# Patient Record
Sex: Female | Born: 1960 | Race: White | Hispanic: No | Marital: Married | Smoking: Never smoker
Health system: Southern US, Community
[De-identification: ages and names within clinical notes are randomized; demographics above are authoritative.]

## PROBLEM LIST (undated history)

## (undated) DIAGNOSIS — Z9889 Other specified postprocedural states: Secondary | ICD-10-CM

## (undated) DIAGNOSIS — M549 Dorsalgia, unspecified: Secondary | ICD-10-CM

## (undated) DIAGNOSIS — R350 Frequency of micturition: Secondary | ICD-10-CM

## (undated) DIAGNOSIS — R3915 Urgency of urination: Secondary | ICD-10-CM

## (undated) DIAGNOSIS — N3010 Interstitial cystitis (chronic) without hematuria: Secondary | ICD-10-CM

## (undated) DIAGNOSIS — R351 Nocturia: Secondary | ICD-10-CM

## (undated) DIAGNOSIS — R112 Nausea with vomiting, unspecified: Secondary | ICD-10-CM

## (undated) DIAGNOSIS — F419 Anxiety disorder, unspecified: Secondary | ICD-10-CM

## (undated) HISTORY — DX: Anxiety disorder, unspecified: F41.9

## (undated) HISTORY — DX: Dorsalgia, unspecified: M54.9

---

## 1998-05-15 ENCOUNTER — Other Ambulatory Visit: Admission: RE | Admit: 1998-05-15 | Discharge: 1998-05-15 | Payer: Self-pay | Admitting: Obstetrics & Gynecology

## 1998-08-01 ENCOUNTER — Inpatient Hospital Stay (HOSPITAL_COMMUNITY): Admission: AD | Admit: 1998-08-01 | Discharge: 1998-08-01 | Payer: Self-pay | Admitting: Obstetrics & Gynecology

## 1998-12-06 ENCOUNTER — Inpatient Hospital Stay (HOSPITAL_COMMUNITY): Admission: AD | Admit: 1998-12-06 | Discharge: 1998-12-09 | Payer: Self-pay | Admitting: Obstetrics and Gynecology

## 1999-01-12 ENCOUNTER — Other Ambulatory Visit: Admission: RE | Admit: 1999-01-12 | Discharge: 1999-01-12 | Payer: Self-pay | Admitting: Obstetrics & Gynecology

## 1999-05-23 ENCOUNTER — Ambulatory Visit (HOSPITAL_COMMUNITY): Admission: RE | Admit: 1999-05-23 | Discharge: 1999-05-23 | Payer: Self-pay | Admitting: Obstetrics & Gynecology

## 1999-05-23 ENCOUNTER — Encounter: Payer: Self-pay | Admitting: Obstetrics & Gynecology

## 2000-02-22 ENCOUNTER — Other Ambulatory Visit: Admission: RE | Admit: 2000-02-22 | Discharge: 2000-02-22 | Payer: Self-pay | Admitting: *Deleted

## 2000-10-17 ENCOUNTER — Encounter: Payer: Self-pay | Admitting: Obstetrics & Gynecology

## 2000-10-17 ENCOUNTER — Ambulatory Visit (HOSPITAL_COMMUNITY): Admission: RE | Admit: 2000-10-17 | Discharge: 2000-10-17 | Payer: Self-pay | Admitting: Obstetrics & Gynecology

## 2001-02-05 ENCOUNTER — Other Ambulatory Visit: Admission: RE | Admit: 2001-02-05 | Discharge: 2001-02-05 | Payer: Self-pay | Admitting: Obstetrics & Gynecology

## 2001-08-10 ENCOUNTER — Other Ambulatory Visit: Admission: RE | Admit: 2001-08-10 | Discharge: 2001-08-10 | Payer: Self-pay | Admitting: Obstetrics & Gynecology

## 2002-02-12 ENCOUNTER — Observation Stay (HOSPITAL_COMMUNITY): Admission: AD | Admit: 2002-02-12 | Discharge: 2002-02-13 | Payer: Self-pay | Admitting: Obstetrics & Gynecology

## 2002-02-24 ENCOUNTER — Inpatient Hospital Stay (HOSPITAL_COMMUNITY): Admission: AD | Admit: 2002-02-24 | Discharge: 2002-02-27 | Payer: Self-pay | Admitting: Obstetrics and Gynecology

## 2002-04-06 ENCOUNTER — Other Ambulatory Visit: Admission: RE | Admit: 2002-04-06 | Discharge: 2002-04-06 | Payer: Self-pay | Admitting: Obstetrics & Gynecology

## 2002-05-06 HISTORY — PX: OTHER SURGICAL HISTORY: SHX169

## 2002-09-16 ENCOUNTER — Ambulatory Visit (HOSPITAL_COMMUNITY): Admission: RE | Admit: 2002-09-16 | Discharge: 2002-09-16 | Payer: Self-pay | Admitting: Obstetrics & Gynecology

## 2003-01-21 ENCOUNTER — Encounter: Payer: Self-pay | Admitting: Obstetrics & Gynecology

## 2003-01-21 ENCOUNTER — Encounter: Admission: RE | Admit: 2003-01-21 | Discharge: 2003-01-21 | Payer: Self-pay | Admitting: Obstetrics & Gynecology

## 2003-03-30 ENCOUNTER — Encounter (INDEPENDENT_AMBULATORY_CARE_PROVIDER_SITE_OTHER): Payer: Self-pay | Admitting: Specialist

## 2003-03-30 ENCOUNTER — Ambulatory Visit (HOSPITAL_COMMUNITY): Admission: RE | Admit: 2003-03-30 | Discharge: 2003-03-30 | Payer: Self-pay | Admitting: Urology

## 2003-03-30 ENCOUNTER — Ambulatory Visit (HOSPITAL_BASED_OUTPATIENT_CLINIC_OR_DEPARTMENT_OTHER): Admission: RE | Admit: 2003-03-30 | Discharge: 2003-03-30 | Payer: Self-pay | Admitting: Urology

## 2003-05-27 ENCOUNTER — Other Ambulatory Visit: Admission: RE | Admit: 2003-05-27 | Discharge: 2003-05-27 | Payer: Self-pay | Admitting: Obstetrics and Gynecology

## 2003-10-31 ENCOUNTER — Ambulatory Visit (HOSPITAL_COMMUNITY): Admission: RE | Admit: 2003-10-31 | Discharge: 2003-10-31 | Payer: Self-pay | Admitting: Orthopedic Surgery

## 2004-03-22 ENCOUNTER — Encounter: Admission: RE | Admit: 2004-03-22 | Discharge: 2004-03-22 | Payer: Self-pay | Admitting: Obstetrics & Gynecology

## 2004-09-19 ENCOUNTER — Other Ambulatory Visit: Admission: RE | Admit: 2004-09-19 | Discharge: 2004-09-19 | Payer: Self-pay | Admitting: Obstetrics & Gynecology

## 2004-09-19 ENCOUNTER — Other Ambulatory Visit: Admission: RE | Admit: 2004-09-19 | Discharge: 2004-09-19 | Payer: Self-pay | Admitting: Obstetrics and Gynecology

## 2005-04-08 ENCOUNTER — Other Ambulatory Visit: Admission: RE | Admit: 2005-04-08 | Discharge: 2005-04-08 | Payer: Self-pay | Admitting: Obstetrics & Gynecology

## 2005-04-17 ENCOUNTER — Encounter: Admission: RE | Admit: 2005-04-17 | Discharge: 2005-04-17 | Payer: Self-pay | Admitting: Obstetrics & Gynecology

## 2005-12-20 ENCOUNTER — Ambulatory Visit (HOSPITAL_COMMUNITY): Admission: RE | Admit: 2005-12-20 | Discharge: 2005-12-20 | Payer: Self-pay | Admitting: Obstetrics & Gynecology

## 2005-12-20 ENCOUNTER — Encounter (INDEPENDENT_AMBULATORY_CARE_PROVIDER_SITE_OTHER): Payer: Self-pay | Admitting: Specialist

## 2005-12-20 HISTORY — PX: OTHER SURGICAL HISTORY: SHX169

## 2006-04-24 ENCOUNTER — Encounter: Admission: RE | Admit: 2006-04-24 | Discharge: 2006-04-24 | Payer: Self-pay | Admitting: Obstetrics & Gynecology

## 2006-04-25 ENCOUNTER — Ambulatory Visit (HOSPITAL_COMMUNITY): Admission: RE | Admit: 2006-04-25 | Discharge: 2006-04-25 | Payer: Self-pay | Admitting: *Deleted

## 2006-05-06 HISTORY — PX: OTHER SURGICAL HISTORY: SHX169

## 2006-10-24 ENCOUNTER — Ambulatory Visit (HOSPITAL_COMMUNITY): Admission: RE | Admit: 2006-10-24 | Discharge: 2006-10-24 | Payer: Self-pay | Admitting: Sports Medicine

## 2007-02-24 ENCOUNTER — Encounter: Admission: RE | Admit: 2007-02-24 | Discharge: 2007-02-24 | Payer: Self-pay | Admitting: Otolaryngology

## 2007-04-28 ENCOUNTER — Encounter: Admission: RE | Admit: 2007-04-28 | Discharge: 2007-04-28 | Payer: Self-pay | Admitting: Obstetrics & Gynecology

## 2007-05-25 ENCOUNTER — Ambulatory Visit (HOSPITAL_COMMUNITY): Admission: RE | Admit: 2007-05-25 | Discharge: 2007-05-25 | Payer: Self-pay | Admitting: Family Medicine

## 2007-10-26 ENCOUNTER — Ambulatory Visit (HOSPITAL_COMMUNITY): Admission: RE | Admit: 2007-10-26 | Discharge: 2007-10-26 | Payer: Self-pay | Admitting: Family Medicine

## 2008-05-02 ENCOUNTER — Encounter: Admission: RE | Admit: 2008-05-02 | Discharge: 2008-05-02 | Payer: Self-pay | Admitting: Obstetrics & Gynecology

## 2009-05-04 ENCOUNTER — Encounter: Admission: RE | Admit: 2009-05-04 | Discharge: 2009-05-04 | Payer: Self-pay | Admitting: Obstetrics & Gynecology

## 2010-05-08 ENCOUNTER — Encounter
Admission: RE | Admit: 2010-05-08 | Discharge: 2010-05-08 | Payer: Self-pay | Source: Home / Self Care | Attending: Obstetrics & Gynecology | Admitting: Obstetrics & Gynecology

## 2010-05-27 ENCOUNTER — Encounter: Payer: Self-pay | Admitting: Obstetrics & Gynecology

## 2010-09-21 NOTE — Op Note (Signed)
NAMESHAINE, MOUNT            ACCOUNT NO.:  [id number]   MEDICAL RECORD NO.:  [id number]          PATIENT TYPE:  AMB   LOCATION:  SDC                           FACILITY:  [facility]   PHYSICIAN:  Freddy Finner, M.D.   DATE OF BIRTH:  06/24/1960   DATE OF PROCEDURE:  12/20/2005  DATE OF DISCHARGE:                                 OPERATIVE REPORT   PREOPERATIVE DIAGNOSES:  Menorrhagia, uterine thickening possibly consistent  with adenomyosis.   POSTOPERATIVE DIAGNOSES:  Menorrhagia, uterine thickening possibly  consistent with adenomyosis.   OPERATIVE PROCEDURE:  Hysteroscopy, D&C, NovaSure endometrial ablation.   ESTIMATED INTRAOPERATIVE BLOOD LOSS:  10 mL.   LACTATED RINGER'S:  150.   ANESTHESIA:  Essentially general, started with managed IV sedation,  requiring heavier sedation plus paracervical block which was placed by  injecting approximately 4 mL of 1% plain Xylocaine at 4 and 8 o'clock in the  vaginal fornices.   INTRAOPERATIVE COMPLICATIONS:  None.   The patient is a 50 year old with very heavy menstrual periods who had a  sono histogram in the office which showed some thickening of the myometrium  but no intracavitary abnormalities.  The possibility of failure of the  procedure due to the adenomyosis has been discussed with the patient, and  she is willing to proceed in hopes of getting some significant reduction in  her menorrhagia with a minor surgical procedure.  She was admitted on the  morning of surgery.  She was given a bolus of Ancef preoperatively.  She was  brought to the operating room, placed under IV sedation, placed in the  dorsal lithotomy position.  Mons, perineum and vagina were prepped in the  usual fashion.  Bivalve speculum was introduced.  Cervix was visualized,  grasped on the anterior lip with a single-tooth tenaculum.  Paracervical  block was placed at this time as noted above.  The cervix sounded to 3 cm.  Uterus sounded to 9.5 cm.  The cervix  was progressively dilated to 25 with  Pratts.  The [facility] hysteroscope was introduced using lactated Ringer's as a  distending medium.  The cavity was normal in configuration and no evidence  of polypoid or fibroid masses.  Gentle thorough curettage was carried out  and tissue submitted for examination.  The NovaSure device was applied  without difficulty.  Cavity width was 4.9 cm.  The ablation was accomplished  without difficulty using 162 watts of power for 75 seconds.  Reinspection of  the cavity revealed complete endometrial ablation.  Photographs were made  before and after the ablation.  The patient was awakened and taken to  recovery in good condition.      Freddy Finner, M.D.  Electronically Signed    WRN/MEDQ  D:  12/20/2005  T:  12/20/2005  Job:  [id number]

## 2010-09-21 NOTE — Op Note (Signed)
Kristen Guzman, Kristen Guzman                      ACCOUNT NO.:  [id number]   MEDICAL RECORD NO.:  [id number]                   PATIENT TYPE:  AMB   LOCATION:  SDC                                  [facility]ACILITY:  [facility]   PHYSICIAN:  Freddy Finner, M.D.                DATE OF BIRTH:  April 25, 1961   DATE OF PROCEDURE:  09/16/2002  DATE OF DISCHARGE:                                 OPERATIVE REPORT   PREOPERATIVE DIAGNOSIS:  Multiparity, request for surgical sterilization.   POSTOPERATIVE DIAGNOSES:  1. Multiparity, request for surgical sterilization.  2. Irregular enlargement of the uterus consistent with uterine leiomyomata.     Uterine size approximately three times normal.   PROCEDURES:  1. Laparoscopy.  2. Tubal occlusion with Filshie clips.   SURGEON:  Freddy Finner, M.D.   ANESTHESIA:  General.   ESTIMATED BLOOD LOSS:  5 cc.   COMPLICATIONS:  None.   DESCRIPTION OF PROCEDURE:  The patient is a 50 year old who has requested  permanent sterilization.  She was admitted on the morning of surgery.  She  was brought to the operating room.  She was placed under adequate general  anesthesia and placed in the dorsal lithotomy position using the [employer] system.  Betadine prep was carried out in the usual fashion and  bladder was evacuated with [facility] catheter.  Hulka tenaculum was attached  to the cervix under direct visualization and sterile drapes were applied.  Intraumbilical skin incision was made through, ablated and 11 mm Trocar was  introduced.  Initial attempt was with the nonlighted which was not  successful.  The speculum revealed adequate placement within the peritoneal  cavity without evidence of injury on entry.  Pneumoperitoneum was allowed to  accumulate.  Inspection of pelvic contents revealed findings noted above.  These findings were recorded and still photographed.  Filshie clips were  applied to each tube at the isthmic portion.  The device through the  operating channel with the laparoscope to allow placement with a single  puncture in the abdominal wall.  Gas was then allowed to escape from the  abdomen.  All the instruments were removed.  Local with 0.25% plain Marcaine  was injected at the incision site.  The patient was given Toradol 30 mg IV  intraoperatively.  She was awaken and taken to the recovery room in good  condition.  She will be discharged in the immediate postop period for  followup in the office in approximately two weeks.  She was given Vicodin to  take for postoperative pain.  She is to have routine outpatient surgical  instructions.                                               Freddy Finner, M.D.  WRN/MEDQ  D:  09/16/2002  T:  09/16/2002  Job:  [id number]

## 2010-09-21 NOTE — Op Note (Signed)
Kristen Guzman, Kristen Guzman                      ACCOUNT NO.:  [id number]   MEDICAL RECORD NO.:  [id number]                   PATIENT TYPE:  AMB   LOCATION:  NESC                                 FACILITY:  [facility]   PHYSICIAN:  Maretta Bees. Vonita Moss, M.D.             DATE OF BIRTH:  02-01-1961   DATE OF PROCEDURE:  03/30/2003  DATE OF DISCHARGE:                                 OPERATIVE REPORT   PREOPERATIVE DIAGNOSIS:  Rule out interstitial cystitis.   POSTOPERATIVE DIAGNOSIS:  Rule out interstitial cystitis.   PROCEDURE:  Cystoscopy, hydraulic distention of bladder and cold cup bladder  biopsy.   SURGEON:  Maretta Bees. Vonita Moss, M.D.   ANESTHESIA:  General.   INDICATIONS FOR PROCEDURE:  This 50 year old white female has had bladder  pressure and frequency and has undergone therapy with various medications  and a pelvic pain and frequency score was elevated to 19 which is suspicious  for IC.  Dr. Jennette Kettle has no gynecologic reason for this lady's pain and  discomfort. She is brought to the OR today to rule out interstitial  cystitis.   DESCRIPTION OF PROCEDURE:  The patient was brought to the operating room,  placed in lithotomy position, external genitalia were prepped and draped in  the usual fashion. She was cystoscoped and the bladder mucosa was  unremarkable. I filled her bladder with water and she started voiding around  the cystoscope. I had to put some urethral pressure on the scope to let the  bladder fil and she did hold 800 mL of irrigation fluid but after emptying  the bladder she had wide spread some mucosal petechia and gross hemorrhage  consistent with interstitial cystitis. Cold cup bladder biopsies were taken  from hemorrhagic areas on the bladder base and the biopsy sites were  fulgurated with the Bugbee electrode. At this point, there was good  hemostasis, the bladder was emptied, the scope removed and she was given a  B&O suppository per rectum and 30 mg of Toradol IV and  taken to the recovery  room in good condition having tolerated the procedure well.                                               Maretta Bees. Vonita Moss, M.D.    LJP/MEDQ  D:  03/30/2003  T:  03/30/2003  Job:  [id number]   cc:   Freddy Finner, M.D.  [address] [place]  [place] [address]  Fax: [phone]

## 2010-09-21 NOTE — H&P (Signed)
   NAMEMarland Guzman  PRECIOSA, BUNDRICK                      ACCOUNT NO.:  [id number]   MEDICAL RECORD NO.:  [id number]                   PATIENT TYPE:  INP   LOCATION:  9163                                 [facility]ACILITY:  [facility]   PHYSICIAN:  Tracie Harrier, M.D.              DATE OF BIRTH:  1961/03/20   DATE OF ADMISSION:  02/12/2002  DATE OF DISCHARGE:                                HISTORY & PHYSICAL   HISTORY OF PRESENT ILLNESS:  The patient is a 50 year old female gravida 2,  para 1 at 42 and 1/[redacted] weeks gestation admitted for elective induction of  labor at term.  Her pregnancy was complicated by advanced maternal age.  She  had a normal amniocentesis in second trimester.  Her pregnancy otherwise  went very well.  She is having severe back pain and is now admitted for  elective induction because of this severe back pain.   OB LABORATORIES:  Maternal blood type O+.  Rubella immune.  Group B Strep  negative.  Glucola normal (84).   PAST MEDICAL HISTORY:  None.   PAST SURGICAL HISTORY:  1. Exploratory laparotomy 2002.  2. Cryo surgery 1988.  3. Eye surgery.   OB HISTORY:  Normal spontaneous vaginal delivery x1 at term 34 a female  weighing 7 pounds 11 ounces.   CURRENT MEDICATIONS:  Prenatal vitamins.   ALLERGIES:  PENICILLIN and TETANUS.   PHYSICAL EXAMINATION:  VITAL SIGNS:  Stable.  Temperature 97.2, blood  pressure 115/81, fetal heart tones 150s and reactive.  GENERAL:  She is a well-developed, well-nourished female in no acute  distress.  HEENT:  Within normal limits.  NECK:  Supple without adenopathy or thyromegaly.  HEART:  Clear.  LUNGS:  Clear.  BREASTS:  Deferred.  ABDOMEN:  Gravid, nontender.  EXTREMITIES:  Grossly normal.  NEUROLOGIC:  Grossly normal.  PELVIC:  Normal external female genitalia.  Cervical examination is 2 cm,  thick, vertex, and high.   ADMITTING DIAGNOSES:  1. Intrauterine pregnancy at term.  2. Elective induction of labor.    PLAN:  1. Pitocin  induction of labor.  2. I will attempt at artificial rupture of membranes soon with an expected     normal spontaneous vaginal delivery.                                               Tracie Harrier, M.D.    REG/MEDQ  D:  02/12/2002  T:  02/12/2002  Job:  [id number]

## 2011-04-15 ENCOUNTER — Other Ambulatory Visit: Payer: Self-pay | Admitting: Obstetrics & Gynecology

## 2011-04-15 ENCOUNTER — Other Ambulatory Visit (HOSPITAL_COMMUNITY): Payer: Self-pay | Admitting: Obstetrics & Gynecology

## 2011-04-15 DIAGNOSIS — Z1231 Encounter for screening mammogram for malignant neoplasm of breast: Secondary | ICD-10-CM

## 2011-05-10 ENCOUNTER — Ambulatory Visit
Admission: RE | Admit: 2011-05-10 | Discharge: 2011-05-10 | Disposition: A | Discharge status: Home / Self Care | Payer: BC Managed Care – PPO | Source: Ambulatory Visit | Attending: Obstetrics & Gynecology | Admitting: Obstetrics & Gynecology

## 2011-05-10 DIAGNOSIS — Z1231 Encounter for screening mammogram for malignant neoplasm of breast: Secondary | ICD-10-CM

## 2011-07-12 ENCOUNTER — Other Ambulatory Visit (HOSPITAL_COMMUNITY): Payer: Self-pay | Admitting: Family Medicine

## 2011-07-12 DIAGNOSIS — R1011 Right upper quadrant pain: Secondary | ICD-10-CM

## 2011-07-17 ENCOUNTER — Ambulatory Visit (HOSPITAL_COMMUNITY)
Admission: RE | Admit: 2011-07-17 | Discharge: 2011-07-17 | Disposition: A | Discharge status: Home / Self Care | Payer: BC Managed Care – PPO | Source: Ambulatory Visit | Attending: Family Medicine | Admitting: Family Medicine

## 2011-07-17 DIAGNOSIS — R1011 Right upper quadrant pain: Secondary | ICD-10-CM | POA: Insufficient documentation

## 2011-07-19 ENCOUNTER — Other Ambulatory Visit (HOSPITAL_COMMUNITY): Payer: Self-pay | Admitting: Family Medicine

## 2011-07-19 DIAGNOSIS — R110 Nausea: Secondary | ICD-10-CM

## 2011-07-19 DIAGNOSIS — R1011 Right upper quadrant pain: Secondary | ICD-10-CM

## 2011-07-30 ENCOUNTER — Other Ambulatory Visit (HOSPITAL_COMMUNITY): Payer: BC Managed Care – PPO

## 2011-08-06 ENCOUNTER — Encounter (HOSPITAL_COMMUNITY)
Admission: RE | Admit: 2011-08-06 | Discharge: 2011-08-06 | Disposition: A | Discharge status: Home / Self Care | Payer: BC Managed Care – PPO | Source: Ambulatory Visit | Attending: Family Medicine | Admitting: Family Medicine

## 2011-08-06 DIAGNOSIS — R1011 Right upper quadrant pain: Secondary | ICD-10-CM | POA: Insufficient documentation

## 2011-08-06 DIAGNOSIS — R110 Nausea: Secondary | ICD-10-CM

## 2011-08-06 MED ORDER — SINCALIDE 5 MCG IJ SOLR: 0.0200 ug/kg | Freq: Once | INTRAMUSCULAR | Status: DC

## 2011-08-06 MED ORDER — TECHNETIUM TC 99M MEBROFENIN IV KIT
4.5000 | PACK | Freq: Once | INTRAVENOUS | Status: AC | PRN
  Administered 2011-08-06: 5 via INTRAVENOUS

## 2011-09-12 ENCOUNTER — Other Ambulatory Visit: Payer: Self-pay | Admitting: Urology

## 2011-09-26 ENCOUNTER — Encounter (HOSPITAL_BASED_OUTPATIENT_CLINIC_OR_DEPARTMENT_OTHER): Payer: Self-pay | Admitting: *Deleted

## 2011-09-26 NOTE — Progress Notes (Signed)
NPO AFTER MN. ARRIVES AT 0615. NEEDS HG AND URINE PREG. 

## 2011-10-04 ENCOUNTER — Ambulatory Visit (HOSPITAL_BASED_OUTPATIENT_CLINIC_OR_DEPARTMENT_OTHER)
Admission: RE | Admit: 2011-10-04 | Discharge: 2011-10-04 | Disposition: A | Discharge status: Home / Self Care | Payer: BC Managed Care – PPO | Source: Ambulatory Visit | Attending: Urology | Admitting: Urology

## 2011-10-04 ENCOUNTER — Encounter (HOSPITAL_BASED_OUTPATIENT_CLINIC_OR_DEPARTMENT_OTHER): Payer: Self-pay | Admitting: *Deleted

## 2011-10-04 ENCOUNTER — Encounter (HOSPITAL_BASED_OUTPATIENT_CLINIC_OR_DEPARTMENT_OTHER): Payer: Self-pay | Admitting: Anesthesiology

## 2011-10-04 ENCOUNTER — Ambulatory Visit (HOSPITAL_BASED_OUTPATIENT_CLINIC_OR_DEPARTMENT_OTHER): Payer: BC Managed Care – PPO | Admitting: Anesthesiology

## 2011-10-04 ENCOUNTER — Encounter (HOSPITAL_BASED_OUTPATIENT_CLINIC_OR_DEPARTMENT_OTHER)
Admission: RE | Disposition: A | Discharge status: Home / Self Care | Payer: Self-pay | Source: Ambulatory Visit | Attending: Urology

## 2011-10-04 DIAGNOSIS — N3010 Interstitial cystitis (chronic) without hematuria: Secondary | ICD-10-CM

## 2011-10-04 DIAGNOSIS — N393 Stress incontinence (female) (male): Secondary | ICD-10-CM | POA: Insufficient documentation

## 2011-10-04 HISTORY — PX: CYSTO WITH HYDRODISTENSION: SHX5453

## 2011-10-04 HISTORY — DX: Other specified postprocedural states: Z98.890

## 2011-10-04 HISTORY — DX: Frequency of micturition: R35.0

## 2011-10-04 HISTORY — DX: Interstitial cystitis (chronic) without hematuria: N30.10

## 2011-10-04 HISTORY — DX: Nocturia: R35.1

## 2011-10-04 HISTORY — DX: Urgency of urination: R39.15

## 2011-10-04 HISTORY — DX: Nausea with vomiting, unspecified: R11.2

## 2011-10-04 LAB — POCT I-STAT 4, (NA,K, GLUC, HGB,HCT)
Glucose, Bld: 101 mg/dL — ABNORMAL HIGH (ref 70–99)
HCT: 42 % (ref 36.0–46.0)
Hemoglobin: 14.3 g/dL (ref 12.0–15.0)
Potassium: 3.8 mEq/L (ref 3.5–5.1)
Sodium: 144 mEq/L (ref 135–145)

## 2011-10-04 LAB — POCT PREGNANCY, URINE: Preg Test, Ur: NEGATIVE

## 2011-10-04 SURGERY — CYSTOSCOPY, WITH BLADDER HYDRODISTENSION
Anesthesia: General | Site: Bladder | Wound class: Clean Contaminated

## 2011-10-04 MED ORDER — DEXAMETHASONE SODIUM PHOSPHATE 4 MG/ML IJ SOLN
INTRAMUSCULAR | Status: DC | PRN
  Administered 2011-10-04: 10 mg via INTRAVENOUS

## 2011-10-04 MED ORDER — PROPOFOL 10 MG/ML IV EMUL
INTRAVENOUS | Status: DC | PRN
  Administered 2011-10-04: 250 mg via INTRAVENOUS

## 2011-10-04 MED ORDER — ONDANSETRON HCL 4 MG/2ML IJ SOLN
INTRAMUSCULAR | Status: DC | PRN
  Administered 2011-10-04: 4 mg via INTRAVENOUS

## 2011-10-04 MED ORDER — FENTANYL CITRATE 0.05 MG/ML IJ SOLN: 25.0000 ug | INTRAMUSCULAR | Status: DC | PRN

## 2011-10-04 MED ORDER — OXYBUTYNIN CHLORIDE 5 MG PO TABS
5.0000 mg | ORAL_TABLET | Freq: Once | ORAL | Status: AC
  Administered 2011-10-04: 5 mg via ORAL

## 2011-10-04 MED ORDER — DROPERIDOL 2.5 MG/ML IJ SOLN
INTRAMUSCULAR | Status: DC | PRN
  Administered 2011-10-04: 0.625 mg via INTRAVENOUS

## 2011-10-04 MED ORDER — LACTATED RINGERS IV SOLN
INTRAVENOUS | Status: DC
  Administered 2011-10-04: 07:00:00 via INTRAVENOUS

## 2011-10-04 MED ORDER — LIDOCAINE HCL (CARDIAC) 20 MG/ML IV SOLN
INTRAVENOUS | Status: DC | PRN
  Administered 2011-10-04: 60 mg via INTRAVENOUS

## 2011-10-04 MED ORDER — ACETAMINOPHEN 10 MG/ML IV SOLN
INTRAVENOUS | Status: DC | PRN
  Administered 2011-10-04: 1000 mg via INTRAVENOUS

## 2011-10-04 MED ORDER — KETOROLAC TROMETHAMINE 30 MG/ML IJ SOLN
INTRAMUSCULAR | Status: DC | PRN
  Administered 2011-10-04: 30 mg via INTRAVENOUS

## 2011-10-04 MED ORDER — FENTANYL CITRATE 0.05 MG/ML IJ SOLN
INTRAMUSCULAR | Status: DC | PRN
  Administered 2011-10-04 (×2): 50 ug via INTRAVENOUS

## 2011-10-04 MED ORDER — PHENAZOPYRIDINE HCL 200 MG PO TABS
ORAL | Status: DC | PRN
  Administered 2011-10-04: 08:00:00 via INTRAVESICAL

## 2011-10-04 MED ORDER — MIDAZOLAM HCL 5 MG/5ML IJ SOLN
INTRAMUSCULAR | Status: DC | PRN
  Administered 2011-10-04: 2 mg via INTRAVENOUS

## 2011-10-04 MED ORDER — BUPIVACAINE HCL 0.5 % IJ SOLN
INTRAMUSCULAR | Status: DC | PRN
  Administered 2011-10-04: 35 mL

## 2011-10-04 MED ORDER — METOCLOPRAMIDE HCL 5 MG/ML IJ SOLN
INTRAMUSCULAR | Status: DC | PRN
  Administered 2011-10-04: 5 mg via INTRAVENOUS

## 2011-10-04 MED ORDER — TRIAMCINOLONE ACETONIDE 40 MG/ML IJ SUSP
INTRAMUSCULAR | Status: DC | PRN
  Administered 2011-10-04: 40 mg

## 2011-10-04 MED ORDER — CIPROFLOXACIN IN D5W 200 MG/100ML IV SOLN
200.0000 mg | INTRAVENOUS | Status: AC
  Administered 2011-10-04: 200 mg via INTRAVENOUS

## 2011-10-04 MED ORDER — LACTATED RINGERS IV SOLN: INTRAVENOUS | Status: DC

## 2011-10-04 MED ORDER — PHENAZOPYRIDINE HCL 200 MG PO TABS: 200.0000 mg | ORAL_TABLET | Freq: Three times a day (TID) | ORAL | Status: AC | PRN

## 2011-10-04 MED ORDER — STERILE WATER FOR IRRIGATION IR SOLN
Status: DC | PRN
  Administered 2011-10-04: 3000 mL

## 2011-10-04 MED ORDER — PHENAZOPYRIDINE HCL 200 MG PO TABS
200.0000 mg | ORAL_TABLET | Freq: Once | ORAL | Status: AC
  Administered 2011-10-04: 200 mg via ORAL

## 2011-10-04 MED ORDER — HYDROCODONE-ACETAMINOPHEN 10-325 MG PO TABS: 1.0000 | ORAL_TABLET | Freq: Four times a day (QID) | ORAL | Status: AC | PRN

## 2011-10-04 SURGICAL SUPPLY — 24 items
BAG DRAIN URO-CYSTO SKYTR STRL (DRAIN) ×2 IMPLANT
BAG DRN UROCATH (DRAIN) ×1
CANISTER SUCT LVC 12 LTR MEDI- (MISCELLANEOUS) ×1 IMPLANT
CATH FOLEY 2WAY SLVR  5CC 24FR (CATHETERS)
CATH FOLEY 2WAY SLVR 5CC 24FR (CATHETERS) IMPLANT
CLOTH BEACON ORANGE TIMEOUT ST (SAFETY) ×2 IMPLANT
DRAPE CAMERA CLOSED 9X96 (DRAPES) ×2 IMPLANT
ELECT REM PT RETURN 9FT ADLT (ELECTROSURGICAL) ×2
ELECTRODE REM PT RTRN 9FT ADLT (ELECTROSURGICAL) ×1 IMPLANT
GLOVE BIO SURGEON STRL SZ8 (GLOVE) ×2 IMPLANT
GLOVE INDICATOR 6.5 STRL GRN (GLOVE) ×2 IMPLANT
GLOVE INDICATOR 7.0 STRL GRN (GLOVE) ×2 IMPLANT
GOWN PREVENTION PLUS LG XLONG (DISPOSABLE) ×2 IMPLANT
GOWN STRL REIN XL XLG (GOWN DISPOSABLE) ×2 IMPLANT
IV NS IRRIG 3000ML ARTHROMATIC (IV SOLUTION) IMPLANT
NDL SAFETY ECLIPSE 18X1.5 (NEEDLE) IMPLANT
NDL SPNL 22GX7 QUINCKE BK (NEEDLE) IMPLANT
NEEDLE HYPO 18GX1.5 SHARP (NEEDLE)
NEEDLE HYPO 22GX1.5 SAFETY (NEEDLE) ×1 IMPLANT
NEEDLE SPNL 22GX7 QUINCKE BK (NEEDLE) IMPLANT
NS IRRIG 500ML POUR BTL (IV SOLUTION) IMPLANT
PACK CYSTOSCOPY (CUSTOM PROCEDURE TRAY) ×2 IMPLANT
SYR 20CC LL (SYRINGE) ×2 IMPLANT
WATER STERILE IRR 3000ML UROMA (IV SOLUTION) ×2 IMPLANT

## 2011-10-04 NOTE — Anesthesia Preprocedure Evaluation (Signed)
Anesthesia Evaluation  Patient identified by MRN, date of birth, ID band Patient awake    Reviewed: Allergy & Precautions, H&P , NPO status , Patient's Chart, lab work & pertinent test results  History of Anesthesia Complications (+) PONV  Airway Mallampati: II TM Distance: >3 FB Neck ROM: full    Dental No notable dental hx. (+) Teeth Intact and Dental Advisory Given   Pulmonary neg pulmonary ROS,  breath sounds clear to auscultation  Pulmonary exam normal       Cardiovascular Exercise Tolerance: Good negative cardio ROS  Rhythm:regular Rate:Normal     Neuro/Psych negative neurological ROS  negative psych ROS   GI/Hepatic negative GI ROS, Neg liver ROS,   Endo/Other  negative endocrine ROS  Renal/GU negative Renal ROS  negative genitourinary   Musculoskeletal   Abdominal   Peds  Hematology negative hematology ROS (+)   Anesthesia Other Findings   Reproductive/Obstetrics negative OB ROS                           Anesthesia Physical Anesthesia Plan  ASA: I  Anesthesia Plan: General   Post-op Pain Management:    Induction: Intravenous  Airway Management Planned: LMA  Additional Equipment:   Intra-op Plan:   Post-operative Plan:   Informed Consent: I have reviewed the patients History and Physical, chart, labs and discussed the procedure including the risks, benefits and alternatives for the proposed anesthesia with the patient or authorized representative who has indicated his/her understanding and acceptance.   Dental Advisory Given  Plan Discussed with: CRNA and Surgeon  Anesthesia Plan Comments:         Anesthesia Quick Evaluation  

## 2011-10-04 NOTE — Discharge Instructions (Signed)
Post Bladder Surgery Instructions ° ° °General instructions: °   ° Your recent bladder surgery requires very little post hospital care but some definite precautions. ° °Despite the fact that no skin incisions were used, the area around the bladder incisions are raw and covered with scabs to promote healing and prevent bleeding. Certain precautions are needed to insure that the scabs are not disturbed over the next 2-4 weeks while the healing proceeds. ° °Because the raw surface inside your bladder and the irritating effects of urine you may expect frequency of urination and/or urgency (a stronger desire to urinate) and perhaps even getting up at night more often. This will usually resolve or improve slowly over the healing period. You may see some blood in your urine over the first 6 weeks. Do not be alarmed, even if the urine was clear for a while. Get off your feet and drink lots of fluids until clearing occurs. If you start to pass clots or don't improve call us. ° °Catheter: (If you are discharged with a catheter.) ° °1. Keep your catheter secured to your leg at all times with tape or the supplied strap. °2. You may experience leakage of urine around your catheter- as long as the  °catheter continues to drain, this is normal.  If your catheter stops draining  °go to the ER. °3. You may also have blood in your urine, even after it has been clear for  °several days; you may even pass some small blood clots or other material.  This  °is normal as well.  If this happens, sit down and drink plenty of water to help  °make urine to flush out your bladder.  If the blood in your urine becomes worse  °after doing this, contact our office or return to the ER. °4. You may use the leg bag (small bag) during the day, but use the large bag at  °night. ° °Diet: ° °You may return to your normal diet immediately. Because of the raw surface of your bladder, alcohol, spicy foods, foods high in acid and drinks with caffeine may  cause irritation or frequency and should be used in moderation. To keep your urine flowing freely and avoid constipation, drink plenty of fluids during the day (8-10 glasses). Tip: Avoid cranberry juice because it is very acidic. ° °Activity: ° °Your physical activity doesn't need to be restricted. However, if you are very active, you may see some blood in the urine. We suggest that you reduce your activity under the circumstances until the bleeding has stopped. ° °Bowels: ° °It is important to keep your bowels regular during the postoperative period. Straining with bowel movements can cause bleeding. A bowel movement every other day is reasonable. Use a mild laxative if needed, such as milk of magnesia 2-3 tablespoons, or 2 Dulcolax tablets. Call if you continue to have problems. If you had been taking narcotics for pain, before, during or after your surgery, you may be constipated. Take a laxative if necessary. ° ° ° °Medication: ° °You should resume your pre-surgery medications unless told not to. In addition you may be given an antibiotic to prevent or treat infection. Antibiotics are not always necessary. All medication should be taken as prescribed until the bottles are finished unless you are having an unusual reaction to one of the drugs. ° ° °Post Anesthesia Home Care Instructions ° °Activity: °Get plenty of rest for the remainder of the day. A responsible adult should stay with you for   24 hours following the procedure.  °For the next 24 hours, DO NOT: °-Drive a car °-Operate machinery °-Drink alcoholic beverages °-Take any medication unless instructed by your physician °-Make any legal decisions or sign important papers. ° °Meals: °Start with liquid foods such as gelatin or soup. Progress to regular foods as tolerated. Avoid greasy, spicy, heavy foods. If nausea and/or vomiting occur, drink only clear liquids until the nausea and/or vomiting subsides. Call your physician if vomiting continues. ° °Special  Instructions/Symptoms: °Your throat may feel dry or sore from the anesthesia or the breathing tube placed in your throat during surgery. If this causes discomfort, gargle with warm salt water. The discomfort should disappear within 24 hours. ° ° °

## 2011-10-04 NOTE — Anesthesia Procedure Notes (Signed)
Procedure Name: LMA Insertion Date/Time: 10/04/2011 7:38 AM Performed by: Norva Pavlov Pre-anesthesia Checklist: Patient identified, Emergency Drugs available, Suction available and Patient being monitored Patient Re-evaluated:Patient Re-evaluated prior to inductionOxygen Delivery Method: Circle System Utilized Preoxygenation: Pre-oxygenation with 100% oxygen Intubation Type: IV induction Ventilation: Mask ventilation without difficulty LMA: LMA inserted LMA Size: 4.0 Number of attempts: 1 Airway Equipment and Method: bite block Placement Confirmation: positive ETCO2 Tube secured with: Tape Dental Injury: Teeth and Oropharynx as per pre-operative assessment

## 2011-10-04 NOTE — Op Note (Signed)
PATIENT:  Kristen Guzman  PRE-OPERATIVE DIAGNOSIS:  1. History of interstitial cystitis area 2. Stress urinary incontinence  POST-OPERATIVE DIAGNOSIS:  Same  PROCEDURE:  Procedure(s): 1. Cystoscopy with hydrodistention. 2. Urethral dilatation. 3. Injection of local anesthetic.  SURGEON:  Garnett Farm  INDICATION: Mrs. Darnold is a 51 year old female patient with a history of interstitial cystitis. She underwent cystoscopy with hydrodistention in 10/04 revealing an 800 cc capacity bladder under anesthesia with a bladder biopsy that revealed no neoplastic findings but the presence of increased number of mass cells felt to indicate possible interstitial cystitis. She responded quite positively to the hydrodistention alone. She is returned having had a recent UTI and continued urethral irritation with associated frequency, urgency and symptoms similar to her previous interstitial cystitis. We discussed the treatment options and because she had had a good response to hydrodistention the past she elected to proceed with repeat hydrodistention. In addition she has described stress urinary incontinence by history and I told her I would evaluate her under anesthesia for the presence of a cystocele and urethrovesical junction support.  ANESTHESIA:  General  EBL:  Minimal  Description of procedure: After informed consent the patient was taken to the major operating room where she was placed on the table, administered general anesthesia and then moved to the dorsal lithotomy position. Her genitalia sterilely prepped and draped. An official timeout was then performed.   I initially evaluated the anterior vaginal wall by depressing the posterior vaginal wall and checking for the presence of a cystocele. She had no significant cystocele present. There was some laxity in the area of the urethrovesical junction. No lesions were identified. The urethra was palpably normal.  The 21 French cystoscope with  12 lens was then advanced under direct vision through the urethra and ureter was noted be entirely normal. The bladder was then entered and a single previous biopsy scar was noted on the floor of the bladder on the right-hand side. The ureteral orifices were slitlike and in normal position. The bladder was fully and systematically inspected and noted to be free of any tumors, stones or inflammatory lesions. I then performed hydrodistention to 20 cm H2O and then drained the bladder. The bladder capacity was noted to be 800 cc. The terminal effluent was clear. Reinspection of the bladder revealed no significant petechial hemorrhages or glomerulations.  Urethral dilatation was then performed using female sounds starting at 46 [place] and progressing to 68 [place] without difficulty.  I then inserted a 22 French Foley catheter and repeated her hydrodistention. She was left distended for approximately 5 minutes after which the bladder was drained. Again the terminal effluent was noted be clear. With the catheter in place I then performed local anesthetic injection.  With a catheter as a guide to the location of urethra and the balloon as a guide to the level of the bladder neck I injected periurethrally primarily at the level of the bladder neck and extending distally along the course of the urethra on each side using a total of 10 cc of half percent plain Marcaine with 40 mg of Kenalog. I then used a female sound to instill 400 mg of crushed peridium and 10 cc of half percent Marcaine into the bladder at the end of the procedure.  The patient was then awakened and taken to the recovery room in stable and satisfactory condition. She tolerated the procedure well and there were no intraoperative complications.  PLAN OF CARE: Discharge to home after PACU  PATIENT DISPOSITION:  PACU - hemodynamically stable.

## 2011-10-04 NOTE — Transfer of Care (Signed)
Immediate Anesthesia Transfer of Care Note  Patient: Kristen Guzman  Procedure(s) Performed: Procedure(s) (LRB): CYSTOSCOPY/HYDRODISTENSION (N/A)  Patient Location: PACU  Anesthesia Type: General  Level of Consciousness: drowsy, arouses to name, follows commands  Airway & Oxygen Therapy: Patient Spontanous Breathing and Patient connected to face mask oxygen  Post-op Assessment: Report given to PACU RN and Post -op Vital signs reviewed and stable  Post vital signs: Reviewed and stable  Complications: No apparent anesthesia complications

## 2011-10-04 NOTE — H&P (Signed)
History of Present Illness     Possible IC: She was evaluated by Dr. Vonita Moss for urinary frequency and he indicated that he had performed cystoscopy and hydrodistention in the distant past. When he saw her in 10/04 he performed cystoscopy and hydrodistention with an 800 cc capacity found under anesthesia and a bladder biopsy that was benign but revealed increased number of mast cells. He prescribed Elmiron but she did not take the medication and found that she responded to the hydrodistention with resolution of her symptoms for least 3 years.   Interval history:she reports that she recently had a UTI about 7 weeks ago. She has continued to have extraurethral irritation and has seen her gynecologist who felt there was no evidence of yeast and placed her on nystatin/triamcinolone for the effects of the steroid and she is noting some slight improvement. In addition to this she has significant voiding symptoms consisting of frequency urgency but no dysuria. She does have IC and she said this feels exactly like a flare up of her interstitial cystitis. Her voiding symptoms are moderate in severity with no modifying factors.  She mentioned also that she's having difficulty with stress urinary incontinence. She said she had a recent cold and was doing a lot of coughing and this caused significant leakage. This led to her having to wear a protective pad which she believes is in part responsible for the irritation that she has experienced externally.   Past Medical History Problems  1. History of  Plantar Fasciitis 728.71  Surgical History Problems  1. History of  Cystoscopy With Dilation Of Bladder 2. History of  Exploratory Laparotomy 3. History of  Tubal Ligation V25.2  Allergies Medication  1. Elmiron CAPS 2. Penicillins  Family History Problems  1. Family history of  Death In The Family Father died age 60 of pancreatic cancer 2. Paternal history of  Pancreatic Neoplasm 3. Maternal  grandfather's history of  Renal Failure  Social History Problems    Alcohol Use 1 a month   Caffeine Use 1diet soda a day   Marital History - Currently Married   Never A Smoker   Occupation: teaches radiation therapy at [employer]    History of  Tobacco Use V15.82  Review of Systems Genitourinary, constitutional, skin, eye, otolaryngeal, hematologic/lymphatic, cardiovascular, pulmonary, endocrine, musculoskeletal, gastrointestinal, neurological and psychiatric system(s) were reviewed and pertinent findings if present are noted.  Genitourinary: urinary frequency, urinary urgency, nocturia and incontinence, but no dysuria.  Neurological: headache.    Vitals Vital Signs BMI Calculated: 32.29 BSA Calculated: 2.14 Height: 5 ft 9 in Weight: 218 lb  Blood Pressure: 126 / 78 Heart Rate: 61  Physical Exam Constitutional: Well nourished and well developed . No acute distress.  ENT:. The ears and nose are normal in appearance.  Neck: The appearance of the neck is normal and no neck mass is present.  Pulmonary: No respiratory distress and normal respiratory rhythm and effort.  Cardiovascular: Heart rate and rhythm are normal . No peripheral edema.  Abdomen: The abdomen is soft and nontender. No masses are palpated. No CVA tenderness. No hernias are palpable. No hepatosplenomegaly noted.  Lymphatics: The femoral and inguinal nodes are not enlarged or tender.  Skin: Normal skin turgor, no visible rash and no visible skin lesions.  Neuro/Psych:. Mood and affect are appropriate.    Results/Data Urine  COLOR YELLOW   APPEARANCE CLEAR   SPECIFIC GRAVITY 1.010   pH 6.0   GLUCOSE NEG mg/dL  BILIRUBIN  NEG   KETONE NEG mg/dL  BLOOD NEG   PROTEIN NEG mg/dL  UROBILINOGEN 0.2 mg/dL  NITRITE NEG   LEUKOCYTE ESTERASE NEG    Assessment Assessed  1. Chronic Interstitial Cystitis 595.1 2. Female Stress Incontinence 625.6   We discussed the treatment options. She has  elected to proceed with repeat hydrodistention which has been effective for her in the past. We went over the procedure today and she is familiar with it as well as its risks and complications and would like to proceed in that fashion. I will assess the degree of urethral hypermobility and anterior vaginal wall support under anesthesia but did not recommend any form of surgical intervention for her stress incontinence at this time due to her significant bladder symptoms. If they persist a mid urethral sling procedure may be indicated.   Plan    She is scheduled for outpatient cystoscopy with hydrodistention.

## 2011-10-04 NOTE — Anesthesia Postprocedure Evaluation (Signed)
  Anesthesia Post-op Note  Patient: Kristen Guzman  Procedure(s) Performed: Procedure(s) (LRB): CYSTOSCOPY/HYDRODISTENSION (N/A)  Patient Location: PACU  Anesthesia Type: General  Level of Consciousness: awake and alert   Airway and Oxygen Therapy: Patient Spontanous Breathing  Post-op Pain: mild  Post-op Assessment: Post-op Vital signs reviewed, Patient's Cardiovascular Status Stable, Respiratory Function Stable, Patent Airway and No signs of Nausea or vomiting  Post-op Vital Signs: stable  Complications: No apparent anesthesia complications

## 2011-10-07 ENCOUNTER — Encounter (HOSPITAL_BASED_OUTPATIENT_CLINIC_OR_DEPARTMENT_OTHER): Payer: Self-pay | Admitting: Urology

## 2012-05-26 ENCOUNTER — Other Ambulatory Visit: Payer: Self-pay | Admitting: Obstetrics & Gynecology

## 2012-05-26 DIAGNOSIS — Z1231 Encounter for screening mammogram for malignant neoplasm of breast: Secondary | ICD-10-CM

## 2012-06-26 ENCOUNTER — Ambulatory Visit
Admission: RE | Admit: 2012-06-26 | Discharge: 2012-06-26 | Disposition: A | Discharge status: Home / Self Care | Payer: BC Managed Care – PPO | Source: Ambulatory Visit | Attending: Obstetrics & Gynecology | Admitting: Obstetrics & Gynecology

## 2012-06-26 DIAGNOSIS — Z1231 Encounter for screening mammogram for malignant neoplasm of breast: Secondary | ICD-10-CM

## 2013-03-03 ENCOUNTER — Other Ambulatory Visit: Payer: Self-pay | Admitting: Otolaryngology

## 2013-05-27 ENCOUNTER — Other Ambulatory Visit (HOSPITAL_COMMUNITY): Payer: Self-pay | Admitting: Obstetrics & Gynecology

## 2013-05-27 DIAGNOSIS — Z1231 Encounter for screening mammogram for malignant neoplasm of breast: Secondary | ICD-10-CM

## 2013-07-01 ENCOUNTER — Ambulatory Visit (HOSPITAL_COMMUNITY): Payer: BC Managed Care – PPO

## 2013-07-07 ENCOUNTER — Ambulatory Visit (HOSPITAL_COMMUNITY)
Admission: RE | Admit: 2013-07-07 | Discharge: 2013-07-07 | Disposition: A | Discharge status: Home / Self Care | Payer: BC Managed Care – PPO | Source: Ambulatory Visit | Attending: Obstetrics & Gynecology | Admitting: Obstetrics & Gynecology

## 2013-07-07 DIAGNOSIS — Z1231 Encounter for screening mammogram for malignant neoplasm of breast: Secondary | ICD-10-CM | POA: Insufficient documentation

## 2014-01-25 ENCOUNTER — Other Ambulatory Visit: Payer: Self-pay | Admitting: Sports Medicine

## 2014-01-25 DIAGNOSIS — M545 Low back pain: Secondary | ICD-10-CM

## 2014-01-31 ENCOUNTER — Ambulatory Visit
Admission: RE | Admit: 2014-01-31 | Discharge: 2014-01-31 | Disposition: A | Discharge status: Home / Self Care | Payer: BC Managed Care – PPO | Source: Ambulatory Visit | Attending: Sports Medicine | Admitting: Sports Medicine

## 2014-01-31 DIAGNOSIS — M545 Low back pain: Secondary | ICD-10-CM

## 2014-06-14 ENCOUNTER — Other Ambulatory Visit: Payer: Self-pay | Admitting: Obstetrics and Gynecology

## 2014-06-15 LAB — CYTOLOGY - PAP

## 2014-08-04 ENCOUNTER — Other Ambulatory Visit (HOSPITAL_COMMUNITY): Payer: Self-pay | Admitting: Obstetrics & Gynecology

## 2014-08-04 DIAGNOSIS — Z1231 Encounter for screening mammogram for malignant neoplasm of breast: Secondary | ICD-10-CM

## 2014-08-17 ENCOUNTER — Ambulatory Visit (HOSPITAL_COMMUNITY)
Admission: RE | Admit: 2014-08-17 | Discharge: 2014-08-17 | Disposition: A | Discharge status: Home / Self Care | Payer: BC Managed Care – PPO | Source: Ambulatory Visit | Attending: Obstetrics & Gynecology | Admitting: Obstetrics & Gynecology

## 2014-08-17 DIAGNOSIS — Z1231 Encounter for screening mammogram for malignant neoplasm of breast: Secondary | ICD-10-CM | POA: Diagnosis present

## 2015-08-31 ENCOUNTER — Other Ambulatory Visit: Payer: Self-pay

## 2015-08-31 DIAGNOSIS — Z1231 Encounter for screening mammogram for malignant neoplasm of breast: Secondary | ICD-10-CM

## 2015-09-20 ENCOUNTER — Ambulatory Visit
Admission: RE | Admit: 2015-09-20 | Discharge: 2015-09-20 | Disposition: A | Discharge status: Home / Self Care | Payer: BC Managed Care – PPO | Source: Ambulatory Visit

## 2015-09-20 DIAGNOSIS — Z1231 Encounter for screening mammogram for malignant neoplasm of breast: Secondary | ICD-10-CM

## 2016-12-09 ENCOUNTER — Other Ambulatory Visit: Payer: Self-pay | Admitting: Obstetrics & Gynecology

## 2016-12-09 DIAGNOSIS — Z1231 Encounter for screening mammogram for malignant neoplasm of breast: Secondary | ICD-10-CM

## 2016-12-16 ENCOUNTER — Ambulatory Visit
Admission: RE | Admit: 2016-12-16 | Discharge: 2016-12-16 | Disposition: A | Discharge status: Home / Self Care | Payer: BC Managed Care – PPO | Source: Ambulatory Visit | Attending: Obstetrics & Gynecology | Admitting: Obstetrics & Gynecology

## 2016-12-16 DIAGNOSIS — Z1231 Encounter for screening mammogram for malignant neoplasm of breast: Secondary | ICD-10-CM

## 2017-12-09 ENCOUNTER — Other Ambulatory Visit: Payer: Self-pay | Admitting: Obstetrics & Gynecology

## 2017-12-09 DIAGNOSIS — Z1231 Encounter for screening mammogram for malignant neoplasm of breast: Secondary | ICD-10-CM

## 2018-01-01 ENCOUNTER — Ambulatory Visit
Admission: RE | Admit: 2018-01-01 | Discharge: 2018-01-01 | Disposition: A | Discharge status: Home / Self Care | Payer: BC Managed Care – PPO | Source: Ambulatory Visit | Attending: Obstetrics & Gynecology | Admitting: Obstetrics & Gynecology

## 2018-01-01 DIAGNOSIS — Z1231 Encounter for screening mammogram for malignant neoplasm of breast: Secondary | ICD-10-CM

## 2018-12-01 ENCOUNTER — Other Ambulatory Visit: Payer: Self-pay | Admitting: Obstetrics & Gynecology

## 2018-12-01 DIAGNOSIS — Z1231 Encounter for screening mammogram for malignant neoplasm of breast: Secondary | ICD-10-CM

## 2019-01-13 ENCOUNTER — Ambulatory Visit
Admission: RE | Admit: 2019-01-13 | Discharge: 2019-01-13 | Disposition: A | Discharge status: Home / Self Care | Payer: BC Managed Care – PPO | Source: Ambulatory Visit | Attending: Obstetrics & Gynecology | Admitting: Obstetrics & Gynecology

## 2019-01-13 ENCOUNTER — Other Ambulatory Visit: Payer: Self-pay

## 2019-01-13 DIAGNOSIS — Z1231 Encounter for screening mammogram for malignant neoplasm of breast: Secondary | ICD-10-CM

## 2020-03-07 ENCOUNTER — Other Ambulatory Visit: Payer: Self-pay | Admitting: Obstetrics & Gynecology

## 2020-03-07 DIAGNOSIS — Z1231 Encounter for screening mammogram for malignant neoplasm of breast: Secondary | ICD-10-CM

## 2020-04-13 ENCOUNTER — Ambulatory Visit
Admission: RE | Admit: 2020-04-13 | Discharge: 2020-04-13 | Disposition: A | Discharge status: Home / Self Care | Payer: BC Managed Care – PPO | Source: Ambulatory Visit | Attending: Obstetrics & Gynecology | Admitting: Obstetrics & Gynecology

## 2020-04-13 ENCOUNTER — Other Ambulatory Visit: Payer: Self-pay

## 2020-04-13 DIAGNOSIS — Z1231 Encounter for screening mammogram for malignant neoplasm of breast: Secondary | ICD-10-CM

## 2021-02-06 IMAGING — MG DIGITAL SCREENING BILAT W/ TOMO W/ CAD
6 of 10 series · 6 of 30 positions shown · non-contrast
Comparison: Previous exam(s).

CLINICAL DATA: Screening.

EXAM:
DIGITAL SCREENING BILATERAL MAMMOGRAM WITH TOMO AND CAD

[R CC synth-2D]
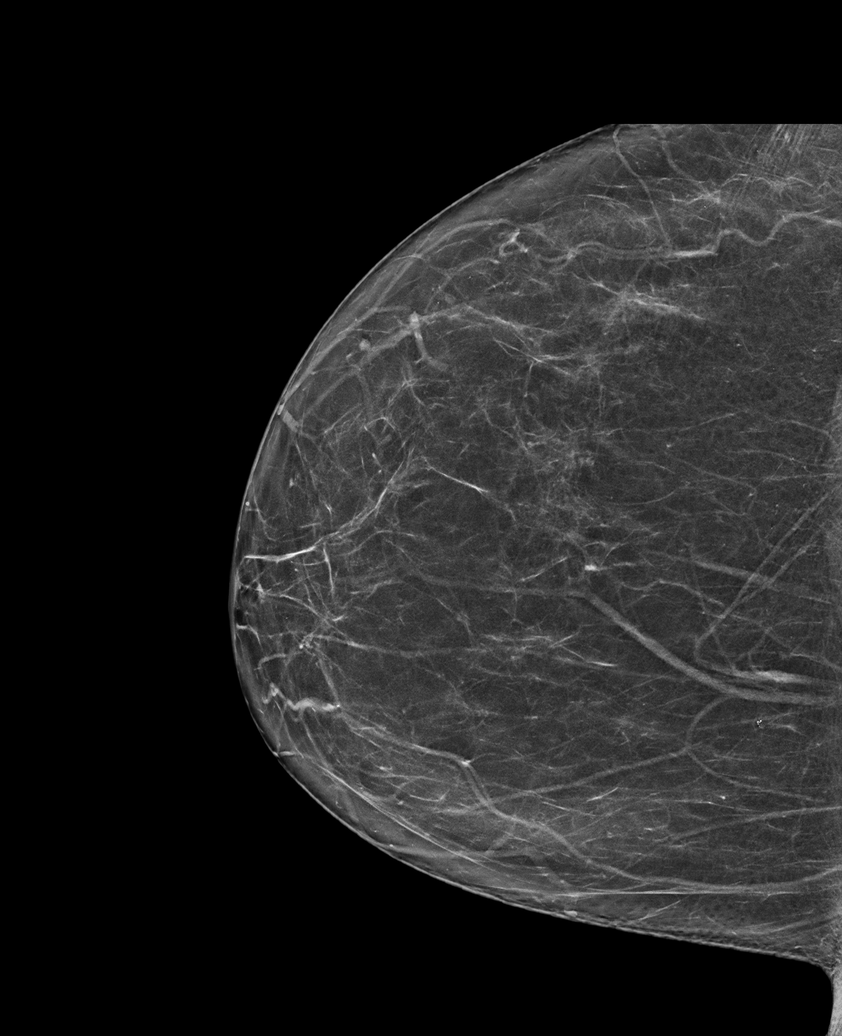

[L MLO synth-2D (1 of 2)]
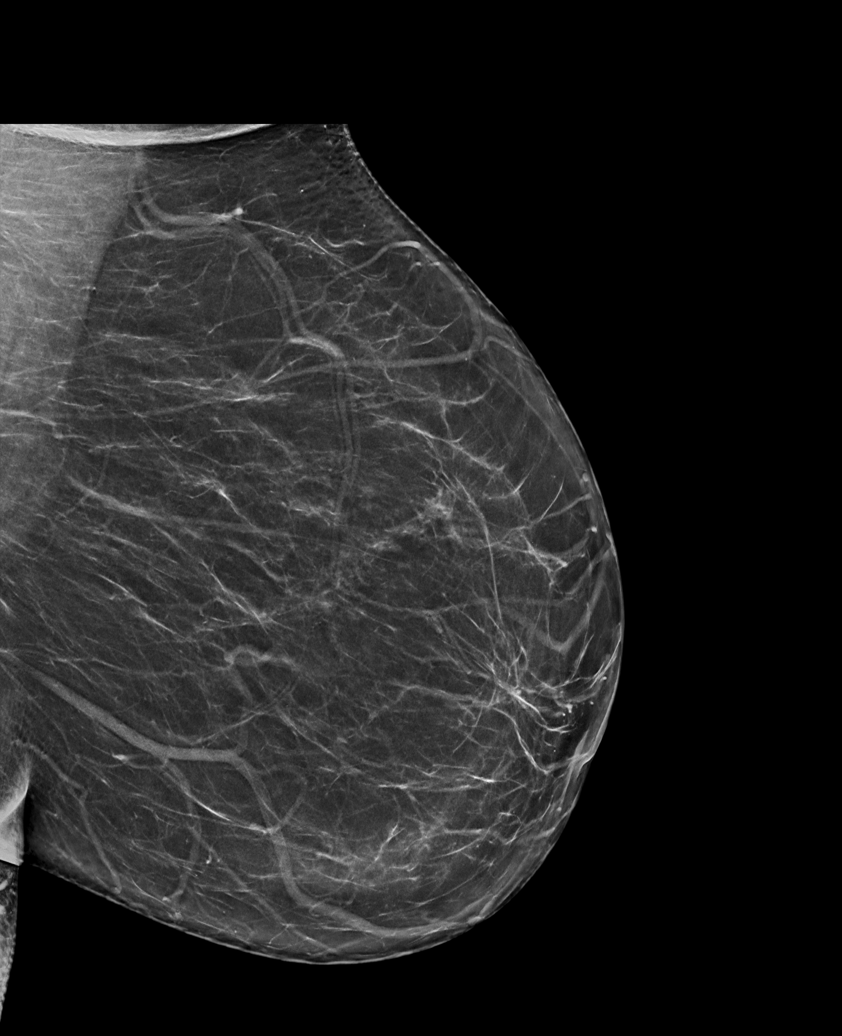

[R MLO synth-2D]
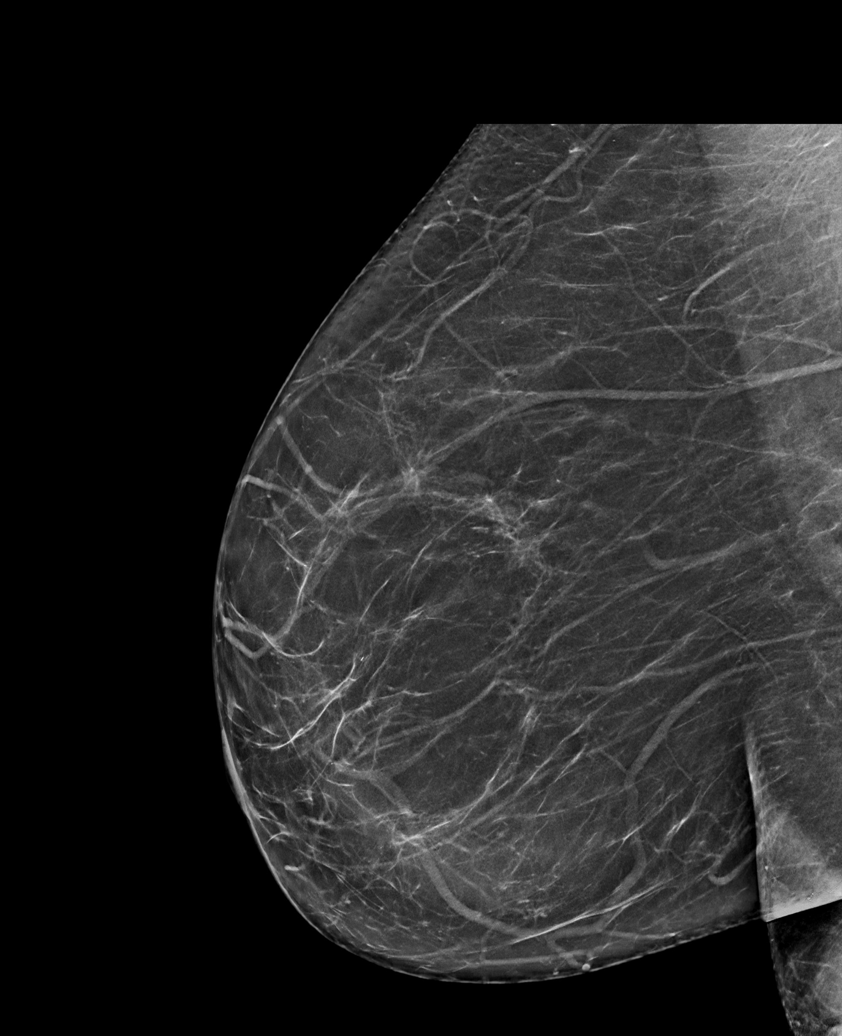

[L CC synth-2D]
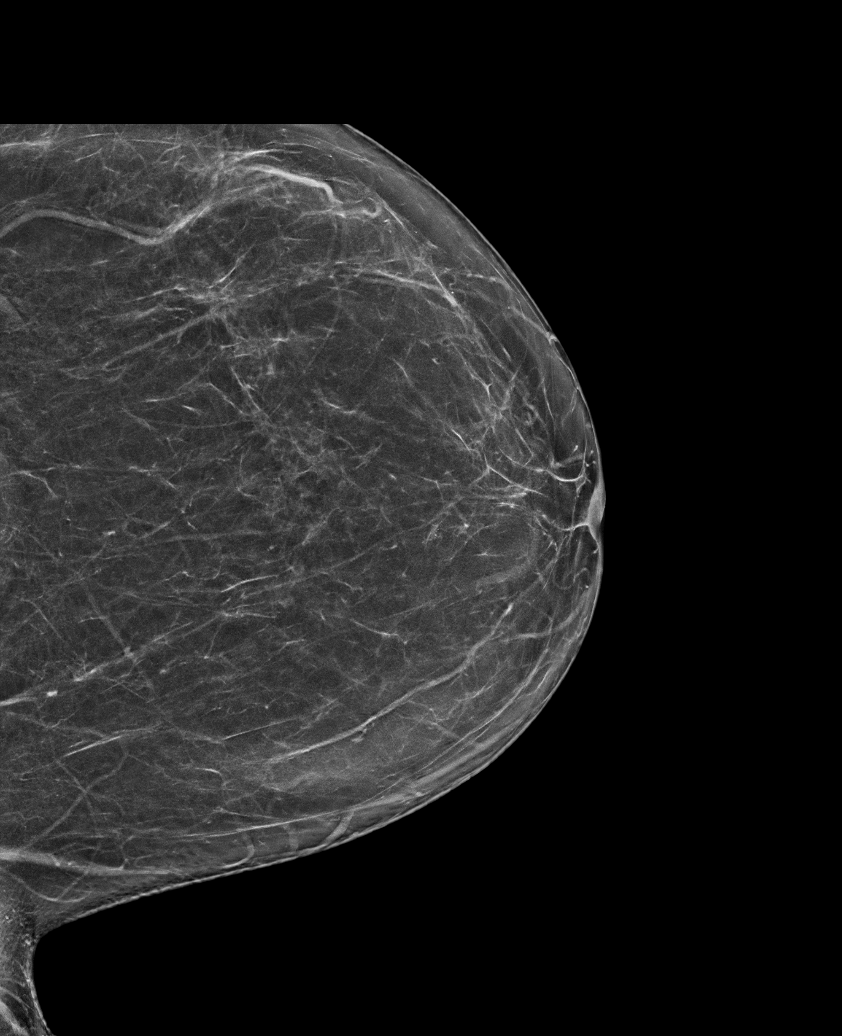

[L MLO synth-2D (2 of 2)]
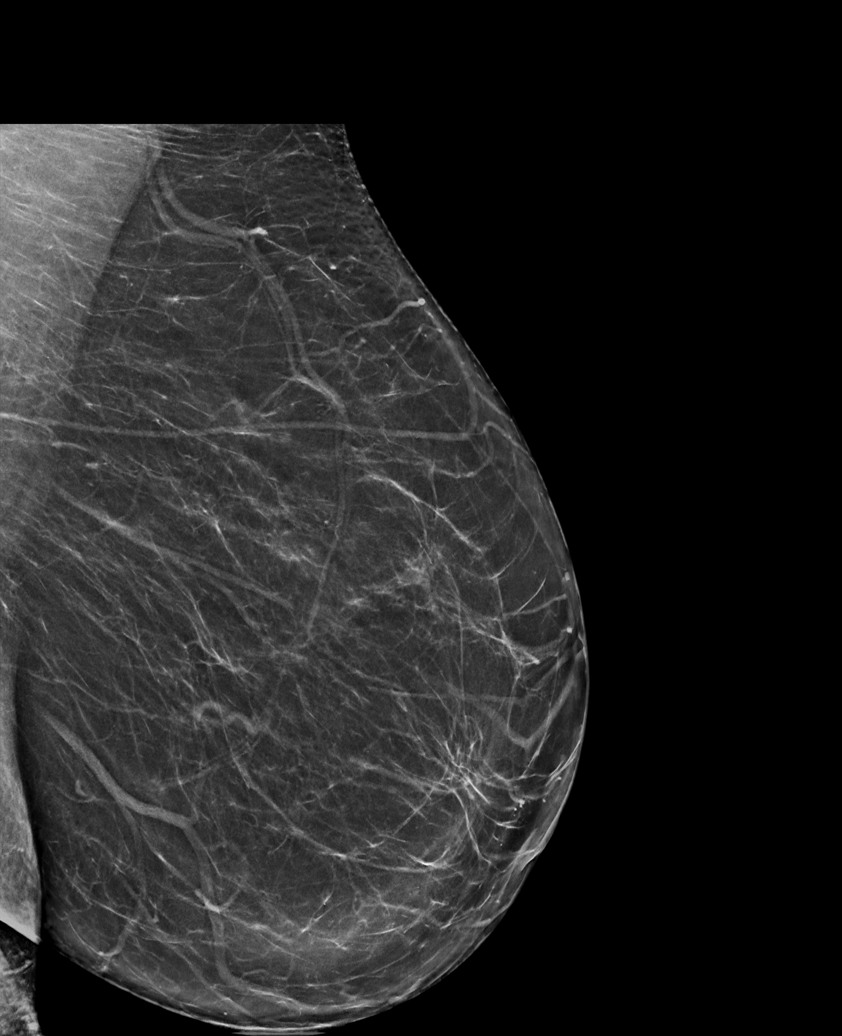

[L CC tomo · tomo slice 37/74.0]
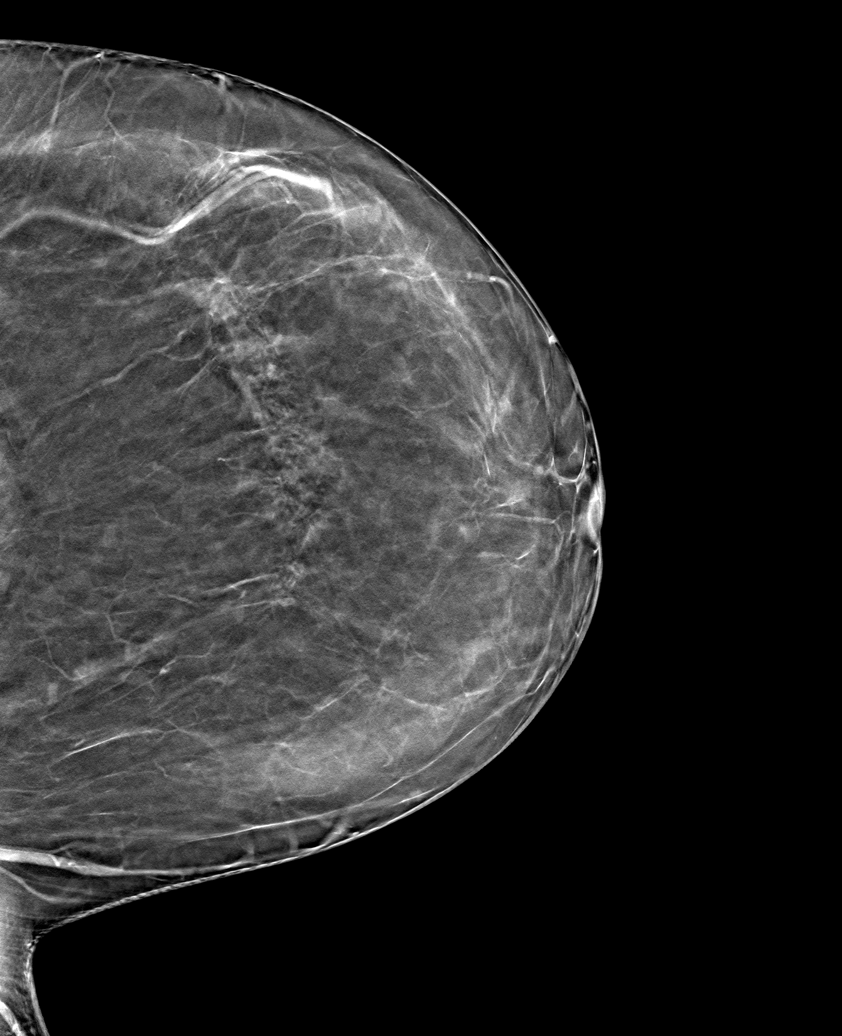

[6 of 30 positions shown; findings below may reference images not displayed]

ACR Breast Density Category b: There are scattered areas of
fibroglandular density.
FINDINGS: There are no findings suspicious for malignancy. Images were
processed with CAD.
IMPRESSION: No mammographic evidence of malignancy. A result letter of this
screening mammogram will be mailed directly to the patient.

RECOMMENDATION:
Screening mammogram in one year. (Code:[id number])

BI-RADS CATEGORY  1: Negative.

## 2021-04-05 ENCOUNTER — Other Ambulatory Visit: Payer: Self-pay | Admitting: Obstetrics & Gynecology

## 2021-04-05 DIAGNOSIS — Z1231 Encounter for screening mammogram for malignant neoplasm of breast: Secondary | ICD-10-CM

## 2021-05-17 ENCOUNTER — Ambulatory Visit: Payer: BC Managed Care – PPO

## 2022-06-27 ENCOUNTER — Other Ambulatory Visit (HOSPITAL_COMMUNITY)
Admission: RE | Admit: 2022-06-27 | Discharge: 2022-06-27 | Disposition: A | Discharge status: Home / Self Care | Payer: BC Managed Care – PPO | Source: Ambulatory Visit | Attending: Radiology | Admitting: Radiology

## 2022-06-27 ENCOUNTER — Ambulatory Visit (INDEPENDENT_AMBULATORY_CARE_PROVIDER_SITE_OTHER): Payer: BC Managed Care – PPO | Admitting: Radiology

## 2022-06-27 ENCOUNTER — Encounter: Payer: Self-pay | Admitting: Radiology

## 2022-06-27 VITALS — BP 122/82 | Ht 67.0 in

## 2022-06-27 DIAGNOSIS — F419 Anxiety disorder, unspecified: Secondary | ICD-10-CM

## 2022-06-27 DIAGNOSIS — Z01419 Encounter for gynecological examination (general) (routine) without abnormal findings: Secondary | ICD-10-CM | POA: Diagnosis not present

## 2022-06-27 MED ORDER — ALPRAZOLAM 0.25 MG PO TABS: 0.2500 mg | ORAL_TABLET | Freq: Two times a day (BID) | ORAL | 0 refills | Status: DC | PRN

## 2022-06-27 NOTE — Progress Notes (Signed)
   Kristen Guzman 04-14-1961 [id number]   History: Postmenopausal 62 y.o. presents for annual exam, transfer from [facility]. No gyn concerns. Needs a refill on alprazolam to use prn for anxiety. Increased stressors at home.   Gynecologic History Postmenopausal Last Pap: 2019. Results were: normal Last mammogram: 05/2022. Results were: normal Last colonoscopy: 2024 HRT use: never  Obstetric History OB History  Gravida Para Term Preterm AB Living  2 2       2  $ SAB IAB Ectopic Multiple Live Births          2    # Outcome Date GA Lbr Len/2nd Weight Sex Delivery Anes PTL Lv  2 Para           1 Para              The following portions of the patient's history were reviewed and updated as appropriate: allergies, current medications, past family history, past medical history, past social history, past surgical history, and problem list.  Review of Systems Pertinent items noted in HPI and remainder of comprehensive ROS otherwise negative.  Past medical history, past surgical history, family history and social history were all reviewed and documented in the EPIC chart.  Exam:  Vitals:   06/27/22 0943  BP: 122/82  Height: 5' 7"$  (1.702 m)   There is no height or weight on file to calculate BMI.  General appearance:  Normal Thyroid:  Symmetrical, normal in size, without palpable masses or nodularity. Respiratory  Auscultation:  Clear without wheezing or rhonchi Cardiovascular  Auscultation:  Regular rate, without rubs, murmurs or gallops  Edema/varicosities:  Not grossly evident Abdominal  Soft,nontender, without masses, guarding or rebound.  Liver/spleen:  No organomegaly noted  Hernia:  None appreciated  Skin  Inspection:  Grossly normal Breasts: Examined lying and sitting.   Right: Without masses, retractions, nipple discharge or axillary adenopathy.   Left: Without masses, retractions, nipple discharge or axillary adenopathy. Genitourinary   Inguinal/mons:  Normal  without inguinal adenopathy  External genitalia:  Normal appearing vulva with no masses, tenderness, or lesions  BUS/Urethra/Skene's glands:  Normal  Vagina:  Normal appearing with normal color and discharge, no lesions. Atrophy: mild   Cervix:  Normal appearing without discharge or lesions  Uterus:  Normal in size, shape and contour.  Midline and mobile, nontender  Adnexa/parametria:     Rt: Normal in size, without masses or tenderness.   Lt: Normal in size, without masses or tenderness.  Anus and perineum: Normal    Patient informed chaperone available to be present for breast and pelvic exam. Patient has requested no chaperone to be present. Patient has been advised what will be completed during breast and pelvic exam.   Assessment/Plan:   1. Anxiety  - ALPRAZolam (XANAX) 0.25 MG tablet; Take 1 tablet (0.25 mg total) by mouth 2 (two) times daily as needed for anxiety.  Dispense: 30 tablet; Refill: 0  2. Well woman exam with routine gynecological exam  - Cytology - PAP( [facility])    Discussed SBE, colonoscopy and DEXA screening as directed. Recommend 1100mns of exercise weekly, including weight bearing exercise. Encouraged the use of seatbelts and sunscreen.  Return in 1 year for annual or sooner prn.  CRubbie BattiestB WHNP-BC, 11:12 AM 06/27/2022

## 2022-06-28 LAB — CYTOLOGY - PAP
Comment: NEGATIVE
Diagnosis: NEGATIVE
High risk HPV: NEGATIVE

## 2022-07-03 NOTE — Telephone Encounter (Signed)
She would have to get this from her PCP

## 2022-10-17 ENCOUNTER — Other Ambulatory Visit: Payer: Self-pay | Admitting: Family Medicine

## 2022-10-17 DIAGNOSIS — R1011 Right upper quadrant pain: Secondary | ICD-10-CM

## 2023-07-02 ENCOUNTER — Ambulatory Visit (INDEPENDENT_AMBULATORY_CARE_PROVIDER_SITE_OTHER): Payer: 59 | Admitting: Radiology

## 2023-07-02 ENCOUNTER — Encounter: Payer: Self-pay | Admitting: Radiology

## 2023-07-02 VITALS — BP 116/78

## 2023-07-02 DIAGNOSIS — F419 Anxiety disorder, unspecified: Secondary | ICD-10-CM

## 2023-07-02 DIAGNOSIS — Z1331 Encounter for screening for depression: Secondary | ICD-10-CM

## 2023-07-02 DIAGNOSIS — Z01419 Encounter for gynecological examination (general) (routine) without abnormal findings: Secondary | ICD-10-CM

## 2023-07-02 MED ORDER — ALPRAZOLAM 0.25 MG PO TABS: 0.2500 mg | ORAL_TABLET | Freq: Two times a day (BID) | ORAL | 0 refills | Status: DC | PRN

## 2023-07-02 NOTE — Progress Notes (Signed)
   Kristen Guzman 12-07-1960 [id number]   History: Postmenopausal 63 y.o. presents for annual exam. No gyn concerns. Needs a refill on alprazolam to use prn for anxiety. Increased stressors at home.   Gynecologic History Postmenopausal Last Pap: 2014. Results were: normal Last mammogram: 05/2023. Results were: normal Last colonoscopy: 2024 HRT use: never  Obstetric History OB History  Gravida Para Term Preterm AB Living  2 2    2   SAB IAB Ectopic Multiple Live Births      2    # Outcome Date GA Lbr Len/2nd Weight Sex Type Anes PTL Lv  2 Para           1 Para              The following portions of the patient's history were reviewed and updated as appropriate: allergies, current medications, past family history, past medical history, past social history, past surgical history, and problem list.  Review of Systems Pertinent items noted in HPI and remainder of comprehensive ROS otherwise negative.  Past medical history, past surgical history, family history and social history were all reviewed and documented in the EPIC chart.  Exam:  Vitals:   07/02/23 1141  BP: 116/78   There is no height or weight on file to calculate BMI.  General appearance:  Normal Thyroid:  Symmetrical, normal in size, without palpable masses or nodularity. Respiratory  Auscultation:  Clear without wheezing or rhonchi Cardiovascular  Auscultation:  Regular rate, without rubs, murmurs or gallops  Edema/varicosities:  Not grossly evident Abdominal  Soft,nontender, without masses, guarding or rebound.  Liver/spleen:  No organomegaly noted  Hernia:  None appreciated  Skin  Inspection:  Grossly normal Breasts: Examined lying and sitting.   Right: Without masses, retractions, nipple discharge or axillary adenopathy.   Left: Without masses, retractions, nipple discharge or axillary adenopathy. Genitourinary   Inguinal/mons:  Normal without inguinal adenopathy  External genitalia:  Normal  appearing vulva with no masses, tenderness, or lesions  BUS/Urethra/Skene's glands:  Normal  Vagina:  Normal appearing with normal color and discharge, no lesions. Atrophy: mild   Cervix:  Normal appearing without discharge or lesions  Uterus:  Normal in size, shape and contour.  Midline and mobile, nontender  Adnexa/parametria:     Rt: Normal in size, without masses or tenderness.   Lt: Normal in size, without masses or tenderness.  Anus and perineum: Normal    Patient informed chaperone available to be present for breast and pelvic exam. Patient has requested no chaperone to be present. Patient has been advised what will be completed during breast and pelvic exam.   Assessment/Plan:   1. Well woman exam with routine gynecological exam (Primary) Pap 2027  2. Anxiety - ALPRAZolam (XANAX) 0.25 MG tablet; Take 1 tablet (0.25 mg total) by mouth 2 (two) times daily as needed for anxiety.  Dispense: 30 tablet; Refill: 0   Discussed SBE, colonoscopy and DEXA screening as directed. Recommend of exercise weekly, including weight bearing exercise. Encouraged the use of seatbelts and sunscreen.  Return in 1 year for annual or sooner prn.  Tanda Rockers WHNP-BC, 12:38 PM 07/02/2023

## 2023-07-02 NOTE — Patient Instructions (Signed)
 Preventive Care 16-61 Years Old, Female  Preventive care refers to lifestyle choices and visits with your health care provider that can promote health and wellness. Preventive care visits are also called wellness exams.  What can I expect for my preventive care visit?  Counseling  Your health care provider may ask you questions about your:  Medical history, including:  Past medical problems.  Family medical history.  Pregnancy history.  Current health, including:  Menstrual cycle.  Method of birth control.  Emotional well-being.  Home life and relationship well-being.  Sexual activity and sexual health.  Lifestyle, including:  Alcohol, nicotine or tobacco, and drug use.  Access to firearms.  Diet, exercise, and sleep habits.  Work and work Astronomer.  Sunscreen use.  Safety issues such as seatbelt and bike helmet use.  Physical exam  Your health care provider will check your:  Height and weight. These may be used to calculate your BMI (body mass index). BMI is a measurement that tells if you are at a healthy weight.  Waist circumference. This measures the distance around your waistline. This measurement also tells if you are at a healthy weight and may help predict your risk of certain diseases, such as type 2 diabetes and high blood pressure.  Heart rate and blood pressure.  Body temperature.  Skin for abnormal spots.  What immunizations do I need?    Vaccines are usually given at various ages, according to a schedule. Your health care provider will recommend vaccines for you based on your age, medical history, and lifestyle or other factors, such as travel or where you work.  What tests do I need?  Screening  Your health care provider may recommend screening tests for certain conditions. This may include:  Lipid and cholesterol levels.  Diabetes screening. This is done by checking your blood sugar (glucose) after you have not eaten for a while (fasting).  Pelvic exam and Pap test.  Hepatitis B test.  Hepatitis C  test.  HIV (human immunodeficiency virus) test.  STI (sexually transmitted infection) testing, if you are at risk.  Lung cancer screening.  Colorectal cancer screening.  Mammogram. Talk with your health care provider about when you should start having regular mammograms. This may depend on whether you have a family history of breast cancer.  BRCA-related cancer screening. This may be done if you have a family history of breast, ovarian, tubal, or peritoneal cancers.  Bone density scan. This is done to screen for osteoporosis.  Talk with your health care provider about your test results, treatment options, and if necessary, the need for more tests.  Follow these instructions at home:  Eating and drinking    Eat a diet that includes fresh fruits and vegetables, whole grains, lean protein, and low-fat dairy products.  Take vitamin and mineral supplements as recommended by your health care provider.  Do not drink alcohol if:  Your health care provider tells you not to drink.  You are pregnant, may be pregnant, or are planning to become pregnant.  If you drink alcohol:  Limit how much you have to 0-1 drink a day.  Know how much alcohol is in your drink. In the [place] one drink equals one 12 oz bottle of beer (355 mL), one 5 oz glass of wine (148 mL), or one 1 oz glass of hard liquor (44 mL).  Lifestyle  Brush your teeth every morning and night with fluoride toothpaste. Floss one time each day.  Exercise for at least  30 minutes 5 or more days each week.  Do not use any products that contain nicotine or tobacco. These products include cigarettes, chewing tobacco, and vaping devices, such as e-cigarettes. If you need help quitting, ask your health care provider.  Do not use drugs.  If you are sexually active, practice safe sex. Use a condom or other form of protection to prevent STIs.  If you do not wish to become pregnant, use a form of birth control. If you plan to become pregnant, see your health care provider for a  prepregnancy visit.  Take aspirin only as told by your health care provider. Make sure that you understand how much to take and what form to take. Work with your health care provider to find out whether it is safe and beneficial for you to take aspirin daily.  Find healthy ways to manage stress, such as:  Meditation, yoga, or listening to music.  Journaling.  Talking to a trusted person.  Spending time with friends and family.  Minimize exposure to UV radiation to reduce your risk of skin cancer.  Safety  Always wear your seat belt while driving or riding in a vehicle.  Do not drive:  If you have been drinking alcohol. Do not ride with someone who has been drinking.  When you are tired or distracted.  While texting.  If you have been using any mind-altering substances or drugs.  Wear a helmet and other protective equipment during sports activities.  If you have firearms in your house, make sure you follow all gun safety procedures.  Seek help if you have been physically or sexually abused.  What's next?  Visit your health care provider once a year for an annual wellness visit.  Ask your health care provider how often you should have your eyes and teeth checked.  Stay up to date on all vaccines.  This information is not intended to replace advice given to you by your health care provider. Make sure you discuss any questions you have with your health care provider.  Document Revised: 10/18/2020 Document Reviewed: 10/18/2020  Elsevier Patient Education  2024 [employer].

## 2024-03-01 ENCOUNTER — Other Ambulatory Visit: Payer: Self-pay | Admitting: Radiology

## 2024-03-01 DIAGNOSIS — F419 Anxiety disorder, unspecified: Secondary | ICD-10-CM

## 2024-03-02 DIAGNOSIS — Z0289 Encounter for other administrative examinations: Secondary | ICD-10-CM

## 2024-03-02 NOTE — Telephone Encounter (Signed)
 Med refill request:   ALPRAZolam  (XANAX ) 0.25 MG tablet  Start:  07/02/23 Disp:  30 tablets Refills:  0  Last AEX:  07/02/23 Next AEX:  Not yet scheduled  *Front desk will contact Pt to schedule annual visit.   Last MMG (if hormonal med):  N/A Refill authorized? Please Advise.

## 2024-03-03 ENCOUNTER — Ambulatory Visit (INDEPENDENT_AMBULATORY_CARE_PROVIDER_SITE_OTHER): Admitting: Physician Assistant

## 2024-03-03 ENCOUNTER — Encounter (INDEPENDENT_AMBULATORY_CARE_PROVIDER_SITE_OTHER): Payer: Self-pay | Admitting: Physician Assistant

## 2024-03-03 VITALS — BP 136/81 | HR 91 | Ht 66.0 in | Wt 232.0 lb

## 2024-03-03 DIAGNOSIS — E669 Obesity, unspecified: Secondary | ICD-10-CM | POA: Diagnosis not present

## 2024-03-03 DIAGNOSIS — G43909 Migraine, unspecified, not intractable, without status migrainosus: Secondary | ICD-10-CM

## 2024-03-03 DIAGNOSIS — Z6837 Body mass index (BMI) 37.0-37.9, adult: Secondary | ICD-10-CM | POA: Diagnosis not present

## 2024-03-03 DIAGNOSIS — E65 Localized adiposity: Secondary | ICD-10-CM

## 2024-03-03 DIAGNOSIS — E66812 Obesity, class 2: Secondary | ICD-10-CM

## 2024-03-03 DIAGNOSIS — E785 Hyperlipidemia, unspecified: Secondary | ICD-10-CM

## 2024-03-03 DIAGNOSIS — M545 Low back pain, unspecified: Secondary | ICD-10-CM | POA: Diagnosis not present

## 2024-03-03 DIAGNOSIS — F419 Anxiety disorder, unspecified: Secondary | ICD-10-CM

## 2024-03-03 DIAGNOSIS — M4310 Spondylolisthesis, site unspecified: Secondary | ICD-10-CM | POA: Diagnosis not present

## 2024-03-03 DIAGNOSIS — N3010 Interstitial cystitis (chronic) without hematuria: Secondary | ICD-10-CM

## 2024-03-03 NOTE — Telephone Encounter (Signed)
 Pt's annual visit has been scheduled for 07/02/24 with Jami C, NP.

## 2024-03-03 NOTE — Progress Notes (Signed)
 Office: [phone]  /  Fax: [phone]   Initial Visit    Kristen Guzman was seen in clinic today to evaluate for obesity. She is interested in losing weight to improve overall health and reduce the risk of weight related complications. She presents today to review program treatment options, initial physical assessment, and evaluation.     She was referred by: Friend or Family  When asked what else they would like to accomplish? She states: Adopt a healthier eating pattern and lifestyle, Improve energy levels and physical activity, Improve existing medical conditions, Improve quality of life, Improve appearance, Improve self-confidence, and would like to be healthier, but does not have targeted amount of weight she would like to loose.   When asked how has your weight affected you? She states: Has affected self-esteem, Contributed to orthopedic problems or mobility issues, and Has affected mood   Weight history: Kristen Guzman is a 63 year old female who presents for initial evaluation for obesity treatment.  She has a long-standing history of weight issues, with a current BMI of 37. She attributes recent weight gain to menopause, describing it as a 'menopause belly.' She exercises regularly, walking two miles a day, five days a week, and is conscious of her diet, aiming to make healthy choices. She does not have a specific weight loss goal but desires to fit into pants she wore 15 years ago.  She has previously tried Ozempic but discontinued it due to excessive fatigue and lack of significant weight change. She has also used Topamax for headaches without experiencing weight loss. She has not tried other medications like metformin or phentermine.  Her highest recorded weight was approximately 245 pounds, and she currently weighs 232 pounds. She has no history of hypertension, diabetes, or cholesterol problems. She has spondylolisthesis and interstitial cystitis, for which she takes  Gemtesa. No history of sleep apnea, heart disease, or lung disease.  She reports a family history of being overweight but is unsure about a family history of type 2 diabetes. She experiences stress related to her child's application to PT school and describes her lifestyle as moderately to highly stressful. She works part-time as a Primary School Teacher and maintains a physically active lifestyle.  In terms of nutrition, she does not follow a specific plan but has tried Weight Watchers, Noom, and tracking/journaling in the past. She experienced some success with Noom.   No fatigue, poor endurance, orthopedic problems affecting daily activities, sleep apnea, diabetes, prediabetes, insulin resistance, hypertension, cholesterol problems, heart disease, lung disease, kidney disease, vitamin D deficiency, connective tissue disease, swelling in the legs, blood clots, or mental health issues beyond anxiety.  Highest weight: 245 lbs  Some associated conditions: Overactive bladder  Contributing factors: family history of obesity, consumption of processed foods, moderate to high levels of stress, menopause, strong orexigenic signaling and/or inadequate inhibitory control , slow metabolism for age, need for convenience due to lack of time, multiple weight loss attempts in the past, and hectic pace of life  Weight promoting medications identified: None  Prior weight loss attempts: Weight Watchers, Noom, Tracking and Journaling, and Balanced Plate / Portion Control  Current nutrition plan: None  Current level of physical activity: Walking 40 minutes, five a week and NEAT  Current or previous pharmacotherapy: Is interested in pharmacotherapy, GLP-1, and Topiramate  Response to medication: Ineffective so it was discontinued- Ozempic and topiramate   Past medical history includes:   Past Medical History:  Diagnosis Date   Frequency  of urination    Interstitial cystitis    Migraine    Nocturia     PONV (postoperative nausea and vomiting)    Urgency of urination      Objective    BP 136/81   Pulse 91   Ht 5' 6 (1.676 m)   Wt 232 lb (105.2 kg)   SpO2 98%   BMI 37.45 kg/m  She was weighed on the bioimpedance scale: Body mass index is 37.45 kg/m.  Body Fat%:45.1%, Visceral Fat Rating:14, Weight trend over the last 12 months: Unchanged  General:  Alert, oriented and cooperative. Patient is in no acute distress.  Respiratory: Normal respiratory effort, no problems with respiration noted   Gait: able to ambulate independently  Mental Status: Normal mood and affect. Normal behavior. Normal judgment and thought content.   DIAGNOSTIC DATA REVIEWED:  BMET    Component Value Date/Time   NA 144 10/04/2011 0717   K 3.8 10/04/2011 0717   GLUCOSE 101 (H) 10/04/2011 0717   No results found for: HGBA1C No results found for: INSULIN CBC    Component Value Date/Time   HGB 14.3 10/04/2011 0717   HCT 42.0 10/04/2011 0717   Iron/TIBC/Ferritin/ %Sat No results found for: IRON, TIBC, FERRITIN, IRONPCTSAT Lipid Panel  No results found for: CHOL, TRIG, HDL, CHOLHDL, VLDL, LDLCALC, LDLDIRECT Hepatic Function Panel  No results found for: PROT, ALBUMIN, AST, ALT, ALKPHOS, BILITOT, BILIDIR, IBILI No results found for: TSH   Assessment and Plan   Hyperlipidemia, unspecified hyperlipidemia type  Interstitial cystitis  Anxiety  Migraine without status migrainosus, not intractable, unspecified migraine type  Spondylolisthesis, unspecified spinal region  Low back pain, unspecified back pain laterality, unspecified chronicity, unspecified whether sciatica present  Visceral obesity- Visceral adipose rating of 14  Class 2 severe obesity due to excess calories with serious comorbidity and body mass index (BMI) of 37.0 to 37.9 in adult  Current BMI 37.5  Assessment and Plan Assessment & Plan Obesity Obesity with a BMI of 37,  exacerbated by menopause-related weight gain, particularly in the midsection. Previous trial of Ozempic was ineffective and caused significant fatigue. She is not interested in injectable medications at this time. Potential insulin resistance post-menopause may contribute to weight challenges. Family history of obesity and possible type 2 diabetes.  Regular exercise includes walking two miles five days a week. She is mindful of her diet and aims to fit into a smaller size of pants rather than a specific weight target. Body composition analysis shows 45.1% body fat, with a visceral adipose rating of 14, indicating a risk for chronic diseases. Interested in a sustainable weight management program without GLP-1 medications. - Enroll in the weight management program with visits every 2-3 weeks for the first three months. - Perform a metabolism test to assess current metabolic rate and nutritional needs. - Check hormone levels, including vitamin D, to rule out deficiencies contributing to metabolic slowdown. - Encourage continuation of current exercise regimen and healthy eating habits. - Provide education on sustainable nutrition plans based on regular grocery store foods. - Offer psychological support for stress and emotional eating through telemedicine if needed.  Back pain with spondylolisthesis  Spondylolisthesis may benefit from weight loss.   Visceral obesity Body composition analysis shows 45.1% body fat, with a visceral adipose rating of 14, indicating a risk for chronic diseases/. Increased risks for certain cancers. Interested in a sustainable weight management program without GLP-1 medications.    Obesity Treatment / Action Plan:  Patient will work  on garnering support from family and friends to begin weight loss journey. Will work on eliminating or reducing the presence of highly palatable, calorie dense foods in the home. Will complete provided nutritional and psychosocial assessment  questionnaire before the next appointment. Will be scheduled for indirect calorimetry to determine resting energy expenditure in a fasting state.  This will allow us  to create a reduced calorie, high-protein meal plan to promote loss of fat mass while preserving muscle mass. Will think about ideas on how to incorporate physical activity into their daily routine. Will reduce the frequency of eating out and making healthier choices by advanced menu planning. Will work on managing stress via relaxation methods as this may result in unhealthy eating patterns. Will work on reading labels, making healthier choices and watching portion sizes. Counseled on the health benefits of losing 5%-15% of total body weight. Will work on improving sleep hygiene and trying to obtain at least 7 hours of sleep. Was counseled on nutritional approaches to weight loss and benefits of reducing processed foods and consuming plant-based foods and high quality protein as part of nutritional weight management. Was counseled on pharmacotherapy and role as an adjunct in weight management.  Will work on increasing water  intake with a goal of 125 ounces for men and 91 ounces for women.   Obesity Education Performed Today:  She was weighed on the bioimpedance scale and results were discussed and documented in the synopsis.  We discussed obesity as a disease and the importance of a more detailed evaluation of all the factors contributing to the disease.  We discussed the importance of long term lifestyle changes which include nutrition, exercise and behavioral modifications as well as the importance of customizing this to her specific health and social needs.  We discussed the benefits of reaching a healthier weight to alleviate the symptoms of existing conditions and reduce the risks of the biomechanical, metabolic and psychological effects of obesity.  We reviewed the four pillars of obesity medicine and importance of using a  multimodal approach.  We reviewed the basic principles in weight management.   Kristen Guzman appears to be in the action stage of change and states they are ready to start intensive lifestyle modifications and behavioral modifications.  I have spent 34 minutes in the care of the patient today including: 2 minutes before the visit reviewing and preparing the chart. 27 minutes face-to-face assessing and reviewing listed medical problems as outlined in obesity care plan, providing nutritional and behavioral counseling on topics outlined in the obesity care plan, independently interpreting test results and goals of care, as described in assessment and plan, reviewing and discussing biometric information and progress, and reviewing latest PCP notes and specialist consultations 5 minutes after the visit updating chart and documentation of encounter.  Reviewed by clinician on day of visit: allergies, medications, problem list, medical history, surgical history, family history, social history, and previous encounter notes pertinent to obesity diagnosis.   Capers Hagmann,PA-C

## 2024-03-15 ENCOUNTER — Encounter (INDEPENDENT_AMBULATORY_CARE_PROVIDER_SITE_OTHER): Payer: Self-pay | Admitting: Family Medicine

## 2024-03-15 ENCOUNTER — Ambulatory Visit (INDEPENDENT_AMBULATORY_CARE_PROVIDER_SITE_OTHER): Admitting: Family Medicine

## 2024-03-15 VITALS — BP 134/81 | HR 88 | Temp 97.6°F | Ht 66.0 in | Wt 228.0 lb

## 2024-03-15 DIAGNOSIS — G43909 Migraine, unspecified, not intractable, without status migrainosus: Secondary | ICD-10-CM | POA: Diagnosis not present

## 2024-03-15 DIAGNOSIS — E785 Hyperlipidemia, unspecified: Secondary | ICD-10-CM | POA: Diagnosis not present

## 2024-03-15 DIAGNOSIS — R739 Hyperglycemia, unspecified: Secondary | ICD-10-CM | POA: Diagnosis not present

## 2024-03-15 DIAGNOSIS — N3010 Interstitial cystitis (chronic) without hematuria: Secondary | ICD-10-CM | POA: Diagnosis not present

## 2024-03-15 DIAGNOSIS — R5383 Other fatigue: Secondary | ICD-10-CM | POA: Diagnosis not present

## 2024-03-15 DIAGNOSIS — Z1331 Encounter for screening for depression: Secondary | ICD-10-CM | POA: Diagnosis not present

## 2024-03-15 DIAGNOSIS — R0602 Shortness of breath: Secondary | ICD-10-CM

## 2024-03-15 DIAGNOSIS — Z6836 Body mass index (BMI) 36.0-36.9, adult: Secondary | ICD-10-CM

## 2024-03-15 DIAGNOSIS — E66812 Obesity, class 2: Secondary | ICD-10-CM

## 2024-03-15 NOTE — Assessment & Plan Note (Signed)
 Has urologist and takes imipramine when she feels spasms coming up.  She voices that she feels well managed at this time.  Follow up at next appointments for any re-emergence of symptoms.

## 2024-03-15 NOTE — Progress Notes (Signed)
 Chief Complaint:  Obesity   Subjective:  Kristen Guzman (MR# [id number]) is a 63 y.o. female who presents for evaluation and treatment of obesity and related comorbidities.   Kristen Guzman is currently in the action stage of change and ready to dedicate time achieving and maintaining a healthier weight. Kristen Guzman is interested in becoming our patient and working on intensive lifestyle modifications including (but not limited to) diet and exercise for weight loss.  Kristen Guzman has been struggling with her weight. She has been unsuccessful in either losing weight, maintaining weight loss, or reaching her healthy weight goal.  Works as part time international aid/development worker in radiation therapy.  She works 15 hours a week- Tuesday, Wednesday, Thursday 8-12 or 9-1.  Lives at home with husband Kristen Guzman, and son Kristen Guzman (22).  They are supportive of her, they eat meals together and they will not be changing how they eat with her.  She is walking 5x a week.  Previously tried Weight Watchers and Noom and lost 15-20 lbs and kept it off for a year.  Experienced gradual regain after that.   Eats fast food or take out 4 times a week- [employer] chicken skewer, grilled nuggets or kid's meal of nuggets at [employer] with kale salad.  May order Chinese take out and get pepper steak with rice. She and her husband both cook. She tends to crave carbs and sweets.   24 hour food recall: Water  bottle in the am, breakfast will be half a bagel and greek yogurt or half a bagel and 2 eggs.  Will have cream cheese on bagel.  Feels satisfied particularly with egg option.  5 hours is lunch.  May have tuna fish sandwich, cheese stick, pretzels.  1 packet of tuna, spinach and lettuce on sandwich, counts pretzels out and takes a serving.  Feels satisfied but could eat more.  Snack in the afternoon- 1 yogurt or pretzels or a few wheat thins.  She is hungry at that time. Dinner is green beans, rice and baked chicken. 1/3 cup of greenb beans, 3/4 cup  rice, 6 oz chicken and a roll.  Feels satisfied.  Drinks another water  bottle after dinner.   Indirect Calorimeter completed today shows a RMR: 1858.  Her calculated basal metabolic rate is 8234 thus her basal metabolic rate is better than expected.  Other Fatigue Kristen Guzman denies daytime somnolence and denies waking up still tired. Kristen Guzman Kristen Guzman generally gets 7 hours of sleep per night, and states that she has generally restful sleep. Snoring is not present. Apneic episodes are not present. Epworth Sleepiness Score is 3.   Shortness of Breath Kristen Guzman notes increasing shortness of breath with exercising and seems to be worsening over time with weight gain. She notes getting out of breath sooner with activity than she used to. This has not gotten worse recently. Desire denies shortness of breath at rest or orthopnea.  Depression Screen Kristen Guzman's Food and Mood (modified PHQ-9) score was 2.     03/15/2024    8:05 AM  Depression screen PHQ 2/9  Decreased Interest 0  Down, Depressed, Hopeless 0  PHQ - 2 Score 0  Altered sleeping 1  Tired, decreased energy 1  Change in appetite 1  Feeling bad or failure about yourself  0  Trouble concentrating 0  Moving slowly or fidgety/restless 0  Suicidal thoughts 0  PHQ-9 Score 3  Difficult doing work/chores Not difficult at all     Objective:  Vitals Temp: 97.6 F (36.4 C) BP: 134/81  Pulse Rate: 88 SpO2: 98 %   Anthropometric Measurements Height: 5' 6 (1.676 m) Weight: 228 lb (103.4 kg) BMI (Calculated): 36.82 Starting Weight: 228 lb Peak Weight: 245 lb Waist Measurement : 50 inches   Body Composition  Body Fat %: 44.3 % Fat Mass (lbs): 101 lbs Muscle Mass (lbs): 120.6 lbs Total Body Water  (lbs): 81.6 lbs Visceral Fat Rating : 13   Other Clinical Data RMR: 1858 Fasting: yes Labs: yes Today's Visit #: 1 Starting Date: 03/15/24 Comments: 1    EKG: Normal sinus rhythm, rate 70.  General: Cooperative, alert,  well developed, in no acute distress. HEENT: Conjunctivae and lids unremarkable. Cardiovascular: Regular rhythm.  Lungs: Normal work of breathing. Neurologic: No focal deficits.   Lab Results  Component Value Date   NA 144 10/04/2011   K 3.8 10/04/2011   No results found for: ALT, AST, GGT, ALKPHOS, BILITOT No results found for: HGBA1C No results found for: INSULIN No results found for: TSH No results found for: CHOL, HDL, LDLCALC, LDLDIRECT, TRIG, CHOLHDL Lab Results  Component Value Date   HGB 14.3 10/04/2011   HCT 42.0 10/04/2011   No results found for: IRON, TIBC, FERRITIN  Assessment and Plan:  Assessment & Plan Other fatigue  SOBOE (shortness of breath on exertion)  Depression screening  Class 2 severe obesity with serious comorbidity and body mass index (BMI) of 36.0 to 36.9 in adult, unspecified obesity type  Hyperlipidemia, unspecified hyperlipidemia type Previously elevated on LDL but not on medications.  Most recent labs in June- repeat FLP today. Interstitial cystitis Has urologist and takes imipramine when she feels spasms coming up.  She voices that she feels well managed at this time.  Follow up at next appointments for any re-emergence of symptoms.   Migraine without status migrainosus, not intractable, unspecified migraine type PCP prescribing [place] if needed.  Feels well managed at this time.  No further intervention. Hyperglycemia Previously elevated blood sugars on evaluation on past labs.  Will order CMP, A1c and Insulin level today.   Other Fatigue  Kristen Guzman does feel that her weight is causing her energy to be lower than it should be. Fatigue may be related to obesity, depression or many other causes. Labs will be ordered, and in the meanwhile, Kristen Guzman will focus on self care including making healthy food choices, increasing physical activity and focusing on stress reduction.  Shortness of Breath  Kristen Guzman  does feel that she gets out of breath more easily that she used to when she exercises. Kristen Guzman shortness of breath appears to be obesity related and exercise induced. She has agreed to work on weight loss and gradually increase exercise to treat her exercise induced shortness of breath. Will continue to monitor closely.   Problem List Items Addressed This Visit       Genitourinary   Interstitial cystitis   Has urologist and takes imipramine when she feels spasms coming up.  She voices that she feels well managed at this time.  Follow up at next appointments for any re-emergence of symptoms.        Relevant Medications   imipramine (TOFRANIL) 25 MG tablet   Other Visit Diagnoses       Other fatigue    -  Primary   Relevant Orders   EKG 12-Lead   Vitamin B12   T4, free   T3   Folate   VITAMIN D 25 Hydroxy (Vit-D Deficiency, Fractures)   TSH     SOBOE (shortness of  breath on exertion)       Relevant Orders   EKG 12-Lead   CBC with Differential/Platelet     Depression screening         Class 2 severe obesity with serious comorbidity and body mass index (BMI) of 36.0 to 36.9 in adult, unspecified obesity type         Hyperlipidemia, unspecified hyperlipidemia type       Relevant Orders   Lipid Panel With LDL/HDL Ratio     Migraine without status migrainosus, not intractable, unspecified migraine type       Relevant Medications   imipramine (TOFRANIL) 25 MG tablet     Hyperglycemia       Relevant Orders   Comprehensive metabolic panel with GFR   Hemoglobin A1c   Insulin, random       Kristen Guzman is currently in the action stage of change and her goal is to continue with weight loss efforts. I recommend Kristen Guzman begin the structured treatment plan as follows:  She has agreed to Category 3 Plan  Exercise goals: All adults should avoid inactivity. Some activity is better than none, and adults who participate in any amount of physical activity, gain some health  benefits.  Behavioral modification strategies:increasing lean protein intake, decreasing simple carbohydrates, increasing vegetables, meal planning and cooking strategies, and planning for success  She was informed of the importance of frequent follow-up visits to maximize her success with intensive lifestyle modifications for her multiple health conditions. She was informed we would discuss her lab results at her next visit unless there is a critical issue that needs to be addressed sooner. Kristen Guzman agreed to keep her next visit at the agreed upon time to discuss these results.  Labs ordered with plans to discuss at the next visit.   Attestation Statements:  Reviewed by clinician on day of visit: allergies, medications, problem list, medical history, surgical history, family history, social history, and previous encounter notes. This is the patient's first visit at [facility]. The patient's NEW PATIENT PACKET was reviewed at length. Included in the packet: current and past health history, medications, allergies, ROS, gynecologic history (women only), surgical history, family history, social history, weight history, weight loss surgery history (for those that have had weight loss surgery), nutritional evaluation, mood and food questionnaire, PHQ9, Epworth questionnaire, sleep habits questionnaire, patient life and health improvement goals questionnaire. These will all be scanned into the patient's chart under media.   During the visit, I independently reviewed the patient's EKG, bioimpedance scale results, and indirect calorimeter results. I used this information to tailor a meal plan for the patient that will help her to lose weight and will improve her obesity-related conditions going forward. I performed a medically necessary appropriate examination and/or evaluation. I discussed the assessment and treatment plan with the patient. The patient was provided an opportunity to ask  questions and all were answered. The patient agreed with the plan and demonstrated an understanding of the instructions. Labs were ordered at this visit and will be reviewed at the next visit unless more critical results need to be addressed immediately. Clinical information was updated and documented in the EMR.     Kristen Cho, MD

## 2024-03-17 LAB — FOLATE: Folate: 10.7 ng/mL (ref >3.0)

## 2024-03-17 LAB — CBC WITH DIFFERENTIAL/PLATELET
Basophils Absolute: 0.1 x10E3/uL (ref 0.0–0.2)
Basos: 1 %
EOS (ABSOLUTE): 0.3 x10E3/uL (ref 0.0–0.4)
Eos: 4 %
Hematocrit: 45.1 % (ref 34.0–46.6)
Hemoglobin: 15.2 g/dL (ref 11.1–15.9)
Immature Grans (Abs): 0 x10E3/uL (ref 0.0–0.1)
Immature Granulocytes: 0 %
Lymphocytes Absolute: 1.8 x10E3/uL (ref 0.7–3.1)
Lymphs: 28 %
MCH: 32 pg (ref 26.6–33.0)
MCHC: 33.7 g/dL (ref 31.5–35.7)
MCV: 95 fL (ref 79–97)
Monocytes Absolute: 0.4 x10E3/uL (ref 0.1–0.9)
Monocytes: 6 %
Neutrophils Absolute: 4 x10E3/uL (ref 1.4–7.0)
Neutrophils: 61 %
Platelets: 380 x10E3/uL (ref 150–450)
RBC: 4.75 x10E6/uL (ref 3.77–5.28)
RDW: 12.2 % (ref 11.7–15.4)
WBC: 6.6 x10E3/uL (ref 3.4–10.8)

## 2024-03-17 LAB — VITAMIN D 25 HYDROXY (VIT D DEFICIENCY, FRACTURES): Vit D, 25-Hydroxy: 28.6 ng/mL — ABNORMAL LOW (ref 30.0–100.0)

## 2024-03-17 LAB — COMPREHENSIVE METABOLIC PANEL WITH GFR
ALT: 12 IU/L (ref 0–32)
AST: 19 IU/L (ref 0–40)
Albumin: 4.5 g/dL (ref 3.9–4.9)
Alkaline Phosphatase: 100 IU/L (ref 49–135)
BUN/Creatinine Ratio: 21 (ref 12–28)
BUN: 21 mg/dL (ref 8–27)
Bilirubin Total: 0.4 mg/dL (ref 0.0–1.2)
CO2: 21 mmol/L (ref 20–29)
Calcium: 10.1 mg/dL (ref 8.7–10.3)
Chloride: 99 mmol/L (ref 96–106)
Creatinine, Ser: 1 mg/dL (ref 0.57–1.00)
Globulin, Total: 2.7 g/dL (ref 1.5–4.5)
Glucose: 96 mg/dL (ref 70–99)
Potassium: 5.4 mmol/L — ABNORMAL HIGH (ref 3.5–5.2)
Sodium: 136 mmol/L (ref 134–144)
Total Protein: 7.2 g/dL (ref 6.0–8.5)
eGFR: 64 mL/min/1.73 (ref >59)

## 2024-03-17 LAB — T3: T3, Total: 122 ng/dL (ref 71–180)

## 2024-03-17 LAB — HEMOGLOBIN A1C
Est. average glucose Bld gHb Est-mCnc: 114 mg/dL
Hgb A1c MFr Bld: 5.6 % (ref 4.8–5.6)

## 2024-03-17 LAB — LIPID PANEL WITH LDL/HDL RATIO
Cholesterol, Total: 188 mg/dL (ref 100–199)
HDL: 61 mg/dL (ref >39)
LDL Chol Calc (NIH): 113 mg/dL — ABNORMAL HIGH (ref 0–99)
LDL/HDL Ratio: 1.9 ratio (ref 0.0–3.2)
Triglycerides: 79 mg/dL (ref 0–149)
VLDL Cholesterol Cal: 14 mg/dL (ref 5–40)

## 2024-03-17 LAB — INSULIN, RANDOM: INSULIN: 17.3 u[IU]/mL (ref 2.6–24.9)

## 2024-03-17 LAB — TSH: TSH: 1.86 u[IU]/mL (ref 0.450–4.500)

## 2024-03-17 LAB — VITAMIN B12: Vitamin B-12: 525 pg/mL (ref 232–1245)

## 2024-03-17 LAB — T4, FREE: Free T4: 1.14 ng/dL (ref 0.82–1.77)

## 2024-03-29 ENCOUNTER — Ambulatory Visit (INDEPENDENT_AMBULATORY_CARE_PROVIDER_SITE_OTHER): Admitting: Nurse Practitioner

## 2024-03-29 ENCOUNTER — Encounter (INDEPENDENT_AMBULATORY_CARE_PROVIDER_SITE_OTHER): Payer: Self-pay | Admitting: Nurse Practitioner

## 2024-03-29 VITALS — BP 121/74 | HR 80 | Temp 97.7°F | Ht 66.0 in | Wt 224.0 lb

## 2024-03-29 DIAGNOSIS — E66812 Obesity, class 2: Secondary | ICD-10-CM

## 2024-03-29 DIAGNOSIS — E559 Vitamin D deficiency, unspecified: Secondary | ICD-10-CM | POA: Diagnosis not present

## 2024-03-29 DIAGNOSIS — E88819 Insulin resistance, unspecified: Secondary | ICD-10-CM | POA: Diagnosis not present

## 2024-03-29 DIAGNOSIS — E785 Hyperlipidemia, unspecified: Secondary | ICD-10-CM

## 2024-03-29 DIAGNOSIS — Z6836 Body mass index (BMI) 36.0-36.9, adult: Secondary | ICD-10-CM

## 2024-03-29 DIAGNOSIS — E875 Hyperkalemia: Secondary | ICD-10-CM

## 2024-03-29 MED ORDER — VITAMIN D (ERGOCALCIFEROL) 1.25 MG (50000 UNIT) PO CAPS: 50000.0000 [IU] | ORAL_CAPSULE | ORAL | 1 refills | Status: DC

## 2024-03-29 NOTE — Progress Notes (Signed)
 Office: [phone]  /  Fax: [phone]  WEIGHT SUMMARY AND BIOMETRICS  Weight Lost Since Last Visit: 4 lb  Weight Gained Since Last Visit: 0   Vitals Temp: 97.7 F (36.5 C) BP: 121/74 Pulse Rate: 80 SpO2: 97 %   Anthropometric Measurements Height: 5' 6 (1.676 m) Weight: 224 lb (101.6 kg) BMI (Calculated): 36.17 Weight at Last Visit: 228 lb Weight Lost Since Last Visit: 4 lb Weight Gained Since Last Visit: 0 Starting Weight: 228 lb Total Weight Loss (lbs): 4 lb (1.814 kg) Peak Weight: 245 lb Waist Measurement : 50 inches   Body Composition  Body Fat %: 44.1 % Fat Mass (lbs): 98.8 lbs Muscle Mass (lbs): 118.8 lbs Total Body Water  (lbs): 83 lbs Visceral Fat Rating : 13   Other Clinical Data Fasting: no Labs: no Today's Visit #: 2 Starting Date: 03/15/24    Total Weight Loss: Percent of body weight lost:   Bio Impedance Data reviewed with patient:  HPI  Chief Complaint: OBESITY  Kristen Guzman is here to discuss her progress with her obesity treatment plan. She is on the the Category 3 Plan and states she is following her eating plan approximately 90 % of the time. She states she is exercising 40 minutes 5 days per week.   Interval History:  Since last office visit she has been getting 100 grams of protein a day and 80-90 ounces of water  daily.  She has been trying to get 1500 calories a day.  She has not skipped any meals.  She will snack between lunch and dinner. Will use greek yogurt.  Thanksgiving will be at another place.  Breakfast: 3 boiled eggs 2 pieces low calorie toast Lunch: turkey and roast beef sandwich with strawberries Dinner; Hamburger with low calorie bread, corn Snack: greek yogurt  Results are reviewed in depth with patient.  Normal fasting glucose, liver and kidney functions, Vit B12, blood count, A1c and thyroid panel. Elevated fasting insulin  at 17.3, HOMA-IR score 4.10- insulin  resistance.  Mild elevation in potassium-  recheck today.  Elevated LDL with remaining lipid panel in normal range. Vit D deficiency- recommend supplementation with Ergocalciferol  50000 units once a week.    Cancer screenings are reviewed with patient as obesity is a risk cancer for certain cancers.   Pap: 06/27/22 NEGATIVE Mammogram: 06/05/23 NEGATIVE Colonoscopy:  06/2022  ASCVD risk is reviewed with patient as obesity is a risk factor for cardiovascular disease  The 10-year ASCVD risk score (Arnett DK, et al., 2019) is: 2.9%- low risk   Values used to calculate the score:     Age: 63 years     Clincally relevant sex: Female     Is Non-Hispanic African American: No     Diabetic: No     Tobacco smoker: No     Systolic Blood Pressure: 121 mmHg     Is BP treated: No     HDL Cholesterol: 61 mg/dL     Total Cholesterol: 188 mg/dL   PHYSICAL EXAM:  Blood pressure 121/74, pulse 80, temperature 97.7 F (36.5 C), height 5' 6 (1.676 m), weight 224 lb (101.6 kg), SpO2 97%. Body mass index is 36.15 kg/m.  General: Well Developed, well nourished, and in no acute distress.  HEENT: Normocephalic, atraumatic; EOMI, sclerae are anicteric. Skin: Warm and dry, good turgor Chest:  Normal excursion, shape, no gross ABN Respiratory: No conversational dyspnea; speaking in full sentences NeuroM-Sk:  Normal gross ROM * 4 extremities  Psych: A and O X 3, insight  adequate, mood- full    DIAGNOSTIC DATA REVIEWED:  BMET    Component Value Date/Time   NA 136 03/15/2024 0844   K 5.4 (H) 03/15/2024 0844   CL 99 03/15/2024 0844   CO2 21 03/15/2024 0844   GLUCOSE 96 03/15/2024 0844   GLUCOSE 101 (H) 10/04/2011 0717   BUN 21 03/15/2024 0844   CREATININE 1.00 03/15/2024 0844   CALCIUM 10.1 03/15/2024 0844   Lab Results  Component Value Date   HGBA1C 5.6 03/15/2024   Lab Results  Component Value Date   INSULIN  17.3 03/15/2024   Lab Results  Component Value Date   TSH 1.860 03/15/2024   CBC    Component Value Date/Time   WBC  6.6 03/15/2024 0844   RBC 4.75 03/15/2024 0844   HGB 15.2 03/15/2024 0844   HCT 45.1 03/15/2024 0844   PLT 380 03/15/2024 0844   MCV 95 03/15/2024 0844   MCH 32.0 03/15/2024 0844   MCHC 33.7 03/15/2024 0844   RDW 12.2 03/15/2024 0844   Iron Studies No results found for: IRON, TIBC, FERRITIN, IRONPCTSAT Lipid Panel     Component Value Date/Time   CHOL 188 03/15/2024 0844   TRIG 79 03/15/2024 0844   HDL 61 03/15/2024 0844   LDLCALC 113 (H) 03/15/2024 0844   Hepatic Function Panel     Component Value Date/Time   PROT 7.2 03/15/2024 0844   ALBUMIN 4.5 03/15/2024 0844   AST 19 03/15/2024 0844   ALT 12 03/15/2024 0844   ALKPHOS 100 03/15/2024 0844   BILITOT 0.4 03/15/2024 0844      Component Value Date/Time   TSH 1.860 03/15/2024 0844   Nutritional Lab Results  Component Value Date   VD25OH 28.6 (L) 03/15/2024     ASSESSMENT AND PLAN  Class 2 severe obesity with serious comorbidity and body mass index (BMI) of 36.0 to 36.9 in adult, unspecified obesity type TREATMENT PLAN FOR OBESITY:  Recommended Dietary Goals  Kristen Guzman is currently in the action stage of change. As such, her goal is to continue weight management plan. She has agreed to the Category 3 Plan.  Behavioral Intervention  We discussed the following Behavioral Modification Strategies today: celebration eating strategies- one plate with protein as largest portion, clean vegetable and then portion of anything she wants to try but food cannot touch on the plate, be mindful and enjoy your food  continue to work on maintaining a reduced calorie state, getting the recommended amount of protein, incorporating whole foods, making healthy choices, staying well hydrated and practicing mindfulness when eating. and increase protein intake, fibrous foods (25 grams per day for women, 30 grams for men) and water  to improve satiety and decrease hunger signals. .    Recommended Physical Activity  Goals  Kristen Guzman has been advised to work up to 150 minutes of moderate intensity aerobic activity a week and strengthening exercises 2-3 times per week for cardiovascular health, weight loss maintenance and preservation of muscle mass.   She has agreed to Think about enjoyable ways to increase daily physical activity and overcoming barriers to exercise, Increase physical activity in their day and reduce sedentary time (increase NEAT)., Increase volume of physical activity to a goal of 240 minutes a week, and Combine aerobic and strengthening exercises for efficiency and improved cardiometabolic health.   Pharmacotherapy We discussed various medication options to help Tanica with her weight loss efforts and we both agreed to start ergocalciferol  50000 units once a week for Vit D deficiency.  ASSOCIATED CONDITIONS ADDRESSED TODAY  Action/Plan  Vitamin D  deficiency Supplement with Ergocalciferol  50000 units once a week for 12 weeks and then recheck level.   -     Vitamin D  (Ergocalciferol ); Take 1 capsule (50,000 Units total) by mouth every 7 (seven) days.  Dispense: 5 capsule; Refill: 1  Hyperlipidemia, unspecified hyperlipidemia type Focus on implementing category 3 meal plan, limit saturated fats Focus on getting 240 minutes a week of moderate to high intensity exercise   Insulin  resistance Continue Category 3  meal plan, limit simple carbohydrates Decreasing body weight by 10-15% can improve glucose levels Continue exercise with current goal of 240 minutes of moderate to high intensity exercise/week.     Hyperkalemia Recheck BMP today and further evaluate pending results -     Basic metabolic panel with GFR         Return in about 3 weeks (around 04/19/2024).SABRA She was informed of the importance of frequent follow up visits to maximize her success with intensive lifestyle modifications for her multiple health conditions.   ATTESTASTION STATEMENTS:  Reviewed by clinician  on day of visit: allergies, medications, problem list, medical history, surgical history, family history, social history, and previous encounter notes.    Abriana Saltos ANP-C

## 2024-03-30 ENCOUNTER — Encounter (INDEPENDENT_AMBULATORY_CARE_PROVIDER_SITE_OTHER): Payer: Self-pay | Admitting: Nurse Practitioner

## 2024-03-30 ENCOUNTER — Ambulatory Visit (INDEPENDENT_AMBULATORY_CARE_PROVIDER_SITE_OTHER): Payer: Self-pay | Admitting: Nurse Practitioner

## 2024-03-30 LAB — BASIC METABOLIC PANEL WITH GFR
BUN/Creatinine Ratio: 36 — ABNORMAL HIGH (ref 12–28)
BUN: 29 mg/dL — ABNORMAL HIGH (ref 8–27)
CO2: 19 mmol/L — ABNORMAL LOW (ref 20–29)
Calcium: 9.8 mg/dL (ref 8.7–10.3)
Chloride: 103 mmol/L (ref 96–106)
Creatinine, Ser: 0.8 mg/dL (ref 0.57–1.00)
Glucose: 75 mg/dL (ref 70–99)
Potassium: 5.2 mmol/L (ref 3.5–5.2)
Sodium: 139 mmol/L (ref 134–144)
eGFR: 84 mL/min/1.73 (ref >59)

## 2024-04-21 ENCOUNTER — Encounter (INDEPENDENT_AMBULATORY_CARE_PROVIDER_SITE_OTHER): Payer: Self-pay | Admitting: Nurse Practitioner

## 2024-04-21 ENCOUNTER — Ambulatory Visit (INDEPENDENT_AMBULATORY_CARE_PROVIDER_SITE_OTHER): Admitting: Nurse Practitioner

## 2024-04-21 VITALS — BP 121/76 | HR 69 | Temp 97.6°F | Ht 66.0 in | Wt 222.0 lb

## 2024-04-21 DIAGNOSIS — E559 Vitamin D deficiency, unspecified: Secondary | ICD-10-CM

## 2024-04-21 DIAGNOSIS — Z6835 Body mass index (BMI) 35.0-35.9, adult: Secondary | ICD-10-CM | POA: Diagnosis not present

## 2024-04-21 DIAGNOSIS — E88819 Insulin resistance, unspecified: Secondary | ICD-10-CM | POA: Diagnosis not present

## 2024-04-21 DIAGNOSIS — E785 Hyperlipidemia, unspecified: Secondary | ICD-10-CM

## 2024-04-21 DIAGNOSIS — E66812 Obesity, class 2: Secondary | ICD-10-CM | POA: Diagnosis not present

## 2024-04-21 NOTE — Progress Notes (Signed)
 Office: [phone]  /  Fax: [phone]  WEIGHT SUMMARY AND BIOMETRICS  Weight Lost Since Last Visit: 2 lb  Weight Gained Since Last Visit: 0   Vitals Temp: 97.6 F (36.4 C) BP: 121/76 Pulse Rate: 69 SpO2: 95 %   Anthropometric Measurements Height: 5' 6 (1.676 m) Weight: 222 lb (100.7 kg) BMI (Calculated): 35.85 Weight at Last Visit: 224 lb Weight Lost Since Last Visit: 2 lb Weight Gained Since Last Visit: 0 Starting Weight: 228 lb Total Weight Loss (lbs): 6 lb (2.722 kg) Peak Weight: 245 lb Waist Measurement : 50 inches   Body Composition  Body Fat %: 43.7 % Fat Mass (lbs): 97 lbs Muscle Mass (lbs): 118.6 lbs Total Body Water  (lbs): 83.4 lbs Visceral Fat Rating : 13   Other Clinical Data Fasting: no Labs: no Today's Visit #: 3 Starting Date: 03/15/24    Total Weight Loss: 6 pounds Percent of body weight lost: 2.6%  Bio Impedance Data reviewed with patient: Muscle is down 0.2 pounds, adipose is down 1.8 pounds.   HPI  Chief Complaint: OBESITY  Kristen Guzman is here to discuss her progress with her obesity treatment plan. She is on the the Category 3 Plan and states she is following her eating plan approximately 80 % of the time. She states she is exercising 45 minutes 5 days per week.   Interval History:  Since last office visit she had Thanksgiving, traveled to [place], birthday party and Christmas party She is getting her protein goal of 120/day, no skipping of meals and getting 80-96 ounces of water  daily. She has been eating more fruits and vegetables.  She has been walking for 45 minutes 5 days a week and doing squats.  She continues to work on nutrition,exercise and weight loss for insulin  resistance and hyperlipidemia- no current medications.   She was started on Ergocalciferol  50000 units once a week for Vit D deficiency at last visit and denies side effects with medication.     PHYSICAL EXAM:  Blood pressure 121/76, pulse 69,  temperature 97.6 F (36.4 C), height 5' 6 (1.676 m), weight 222 lb (100.7 kg), SpO2 95%. Body mass index is 35.83 kg/m.  General: Well Developed, well nourished, and in no acute distress.  HEENT: Normocephalic, atraumatic; EOMI, sclerae are anicteric. Skin: Warm and dry, good turgor Chest:  Normal excursion, shape, no gross ABN Respiratory: No conversational dyspnea; speaking in full sentences NeuroM-Sk:  Normal gross ROM * 4 extremities  Psych: A and O X 3, insight adequate, mood- full    DIAGNOSTIC DATA REVIEWED:  BMET    Component Value Date/Time   NA 139 03/29/2024 1448   K 5.2 03/29/2024 1448   CL 103 03/29/2024 1448   CO2 19 (L) 03/29/2024 1448   GLUCOSE 75 03/29/2024 1448   GLUCOSE 101 (H) 10/04/2011 0717   BUN 29 (H) 03/29/2024 1448   CREATININE 0.80 03/29/2024 1448   CALCIUM 9.8 03/29/2024 1448   Lab Results  Component Value Date   HGBA1C 5.6 03/15/2024   Lab Results  Component Value Date   INSULIN  17.3 03/15/2024   Lab Results  Component Value Date   TSH 1.860 03/15/2024   CBC    Component Value Date/Time   WBC 6.6 03/15/2024 0844   RBC 4.75 03/15/2024 0844   HGB 15.2 03/15/2024 0844   HCT 45.1 03/15/2024 0844   PLT 380 03/15/2024 0844   MCV 95 03/15/2024 0844   MCH 32.0 03/15/2024 0844   MCHC 33.7 03/15/2024 0844  RDW 12.2 03/15/2024 0844   Iron Studies No results found for: IRON, TIBC, FERRITIN, IRONPCTSAT Lipid Panel     Component Value Date/Time   CHOL 188 03/15/2024 0844   TRIG 79 03/15/2024 0844   HDL 61 03/15/2024 0844   LDLCALC 113 (H) 03/15/2024 0844   Hepatic Function Panel     Component Value Date/Time   PROT 7.2 03/15/2024 0844   ALBUMIN 4.5 03/15/2024 0844   AST 19 03/15/2024 0844   ALT 12 03/15/2024 0844   ALKPHOS 100 03/15/2024 0844   BILITOT 0.4 03/15/2024 0844      Component Value Date/Time   TSH 1.860 03/15/2024 0844   Nutritional Lab Results  Component Value Date   VD25OH 28.6 (L) 03/15/2024      ASSESSMENT AND PLAN  Class 2 obesity without serious comorbidity with body mass index (BMI) of 35.0 to 35.9 in adult, unspecified obesity type TREATMENT PLAN FOR OBESITY:  Recommended Dietary Goals  Kristen Guzman is currently in the action stage of change. As such, her goal is to continue weight management plan. She has agreed to the Category 3 Plan.  Behavioral Intervention  We discussed the following Behavioral Modification Strategies today: celebration eating strategies, continue to work on maintaining a reduced calorie state, getting the recommended amount of protein, incorporating whole foods, making healthy choices, staying well hydrated and practicing mindfulness when eating., and increase protein intake, fibrous foods (25 grams per day for women, 30 grams for men) and water  to improve satiety and decrease hunger signals. .  She wants to lose weight in December - She does  recognize that she must follow a structured plan and will eat before social situation to meet her protein goals and minimze party foods - She will give away food gifts unless they are very low calories or high protein - She will avoid calorie-containing liquids such as holiday drinks, eggnog and alcohol -She will stick to her structured plan strictly most of the time - This strategy should help her lose 1-2 pounds in December   Recommended Physical Activity Goals  Kristen Guzman has been advised to work up to 150 minutes of moderate intensity aerobic activity a week and strengthening exercises 2-3 times per week for cardiovascular health, weight loss maintenance and preservation of muscle mass.   She has agreed to Continue current level of physical activity , Increase physical activity in their day and reduce sedentary time (increase NEAT)., and Combine aerobic and strengthening exercises for efficiency and improved cardiometabolic health.   Pharmacotherapy We discussed various medication options to help  Kristen Guzman with her weight loss efforts and we both agreed to continue ergocalciferol  50000 units once a week for Vit D deficiency and denies side effects with this medication. .  ASSOCIATED CONDITIONS ADDRESSED TODAY  Action/Plan  Vitamin D  deficiency       Continue to  supplement with Ergocalciferol  50000 units once a week       Low vitamin D  levels can be associated with adiposity and may result in leptin resistance and weight gain. Also associated with fatigue.        Currently on vitamin D  supplementation without any adverse effects such as nausea, vomiting or muscle weakness.    Hyperlipidemia, unspecified hyperlipidemia type Focus on implementing category 3 meal plan, limit saturated fats. Increased lean protein, fiber and water  Continue to follow regularly with PCP Continue current activity of 45 minutes 5 days a week, add resistance training  Insulin  resistance Continue Category 3  meal plan, limit  simple carbohydrates. Continue to increase fiber, water  and lean protein Decreasing body weight by 10-15% can improve glucose levels Continue current activity of 45 minutes 5 days a week, add resistance training      Return in about 4 weeks (around 05/19/2024).Kristen Guzman She was informed of the importance of frequent follow up visits to maximize her success with intensive lifestyle modifications for her multiple health conditions.   ATTESTASTION STATEMENTS:  Reviewed by clinician on day of visit: allergies, medications, problem list, medical history, surgical history, family history, social history, and previous encounter notes.   I personally spent a total of 21 minutes in the care of the patient today including preparing to see the patient, getting/reviewing separately obtained history, performing a medically appropriate exam/evaluation, counseling and educating, and documenting clinical information in the EHR.   Kimble Hitchens ANP-C

## 2024-05-24 ENCOUNTER — Encounter (INDEPENDENT_AMBULATORY_CARE_PROVIDER_SITE_OTHER): Payer: Self-pay | Admitting: Nurse Practitioner

## 2024-05-24 ENCOUNTER — Ambulatory Visit (INDEPENDENT_AMBULATORY_CARE_PROVIDER_SITE_OTHER): Admitting: Nurse Practitioner

## 2024-05-24 VITALS — BP 135/79 | HR 79 | Temp 97.6°F | Ht 66.0 in | Wt 219.0 lb

## 2024-05-24 DIAGNOSIS — E559 Vitamin D deficiency, unspecified: Secondary | ICD-10-CM | POA: Diagnosis not present

## 2024-05-24 DIAGNOSIS — E785 Hyperlipidemia, unspecified: Secondary | ICD-10-CM

## 2024-05-24 DIAGNOSIS — Z6835 Body mass index (BMI) 35.0-35.9, adult: Secondary | ICD-10-CM

## 2024-05-24 DIAGNOSIS — E88819 Insulin resistance, unspecified: Secondary | ICD-10-CM

## 2024-05-24 DIAGNOSIS — E66812 Obesity, class 2: Secondary | ICD-10-CM

## 2024-05-24 MED ORDER — VITAMIN D (ERGOCALCIFEROL) 1.25 MG (50000 UNIT) PO CAPS: 50000.0000 [IU] | ORAL_CAPSULE | ORAL | 1 refills | Status: AC

## 2024-05-24 NOTE — Progress Notes (Signed)
 " Office: [phone]  /  Fax: [phone]  WEIGHT SUMMARY AND BIOMETRICS  Weight Lost Since Last Visit: 3 lb  Weight Gained Since Last Visit: 0   Vitals Temp: 97.6 F (36.4 C) BP: 135/79 Pulse Rate: 79 SpO2: 98 %   Anthropometric Measurements Height: 5' 6 (1.676 m) Weight: 219 lb (99.3 kg) BMI (Calculated): 35.36 Weight at Last Visit: 222 lb Weight Lost Since Last Visit: 3 lb Weight Gained Since Last Visit: 0 Starting Weight: 228 lb Total Weight Loss (lbs): 9 lb (4.082 kg) Peak Weight: 245 lb Waist Measurement : 50 inches   Body Composition  Body Fat %: 43.4 % Fat Mass (lbs): 95.2 lbs Muscle Mass (lbs): 117.8 lbs Total Body Water  (lbs): 82.4 lbs Visceral Fat Rating : 13   Other Clinical Data Fasting: no Labs: no Today's Visit #: 4 Starting Date: 03/15/24    Total Weight Loss: 9 pounds Percent of body weight lost: 3.9% Bio Impedance Data reviewed with patient: Muscle is down 0.8 pounds, adipose is down 1.8 pounds.   HPI  Chief Complaint: OBESITY  Suha is here to discuss her progress with her obesity treatment plan. She is on the the Category 3 Plan and states she is following her eating plan approximately 80 % of the time. She states she is exercising 45 minutes 4-5 days per week.   Interval History:  Since last office visit she she is getting at least 100 grams of protein. She is eating fresh fruits and vegetables.  She is getting at least 80 ounces of water  She is not skipping meals.  Has started to incorporate different foods into her meal plan- sticks to 1500 calories and 100-120 grams of protein a day   She is currently on Ergocalciferol  50000 units once a week for Vit D deficiency- denies side effects.  Last vitamin D  Lab Results  Component Value Date   VD25OH 28.6 (L) 03/15/2024   She has hyperlipidemia and continues to work on nutrition , exercise and weight loss to lower levels Lab Results  Component Value Date   CHOL 188  03/15/2024   HDL 61 03/15/2024   LDLCALC 113 (H) 03/15/2024   TRIG 79 03/15/2024    She does have insulin  resistance with fasting insulin  of 17.3- she is using nutrition, exercise and weight loss to help lower insulin  levels.   PHYSICAL EXAM:  Blood pressure 135/79, pulse 79, temperature 97.6 F (36.4 C), height 5' 6 (1.676 m), weight 219 lb (99.3 kg), SpO2 98%. Body mass index is 35.35 kg/m.  General: Well Developed, well nourished, and in no acute distress.  HEENT: Normocephalic, atraumatic; EOMI, sclerae are anicteric. Skin: Warm and dry, good turgor Chest:  Normal excursion, shape, no gross ABN Respiratory: No conversational dyspnea; speaking in full sentences NeuroM-Sk:  Normal gross ROM * 4 extremities  Psych: A and O X 3, insight adequate, mood- full    DIAGNOSTIC DATA REVIEWED:  BMET    Component Value Date/Time   NA 139 03/29/2024 1448   K 5.2 03/29/2024 1448   CL 103 03/29/2024 1448   CO2 19 (L) 03/29/2024 1448   GLUCOSE 75 03/29/2024 1448   GLUCOSE 101 (H) 10/04/2011 0717   BUN 29 (H) 03/29/2024 1448   CREATININE 0.80 03/29/2024 1448   CALCIUM 9.8 03/29/2024 1448   Lab Results  Component Value Date   HGBA1C 5.6 03/15/2024   Lab Results  Component Value Date   INSULIN  17.3 03/15/2024   Lab Results  Component Value Date  TSH 1.860 03/15/2024   CBC    Component Value Date/Time   WBC 6.6 03/15/2024 0844   RBC 4.75 03/15/2024 0844   HGB 15.2 03/15/2024 0844   HCT 45.1 03/15/2024 0844   PLT 380 03/15/2024 0844   MCV 95 03/15/2024 0844   MCH 32.0 03/15/2024 0844   MCHC 33.7 03/15/2024 0844   RDW 12.2 03/15/2024 0844   Iron Studies No results found for: IRON, TIBC, FERRITIN, IRONPCTSAT Lipid Panel     Component Value Date/Time   CHOL 188 03/15/2024 0844   TRIG 79 03/15/2024 0844   HDL 61 03/15/2024 0844   LDLCALC 113 (H) 03/15/2024 0844   Hepatic Function Panel     Component Value Date/Time   PROT 7.2 03/15/2024 0844   ALBUMIN  4.5 03/15/2024 0844   AST 19 03/15/2024 0844   ALT 12 03/15/2024 0844   ALKPHOS 100 03/15/2024 0844   BILITOT 0.4 03/15/2024 0844      Component Value Date/Time   TSH 1.860 03/15/2024 0844   Nutritional Lab Results  Component Value Date   VD25OH 28.6 (L) 03/15/2024     ASSESSMENT AND PLAN Class 2 obesity without serious comorbidity with body mass index (BMI) of 35.0 to 35.9 in adult, unspecified obesity type TREATMENT PLAN FOR OBESITY:  Recommended Dietary Goals  Genesia is currently in the action stage of change. As such, her goal is to continue weight management plan. She has agreed to the Category 3 Plan.  Behavioral Intervention  We discussed the following Behavioral Modification Strategies today: work on meal planning and preparation, continue to work on maintaining a reduced calorie state, getting the recommended amount of protein, incorporating whole foods, making healthy choices, staying well hydrated and practicing mindfulness when eating., and increase protein intake, fibrous foods (25 grams per day for women, 30 grams for men) and water  to improve satiety and decrease hunger signals. Discussed incorporation of 1/2 sweet potato and adjusting calories   Recommended Physical Activity Goals  Karianna has been advised to work up to 240 minutes of moderate intensity aerobic activity a week and strengthening exercises 2-3 times per week for cardiovascular health, weight loss maintenance and preservation of muscle mass.   She has agreed to Increase volume of physical activity to a goal of 240 minutes a week and Combine aerobic and strengthening exercises for efficiency and improved cardiometabolic health.   Pharmacotherapy We discussed various medication options to help Shaquira with her weight loss efforts and we both agreed to continue nutrition and behavior modification.  Continue ergocalciferol  50000 units once a week for Vit D deficiency- denies side  effects  ASSOCIATED CONDITIONS ADDRESSED TODAY  Action/Plan  Hyperlipidemia, unspecified hyperlipidemia type Focus on implementing category 3 meal plan, limit saturated fats. Increase lean protein, fiber and water  Continue to follow regularly with PCP Focus on getting 240 minutes a week of moderate to high intensity exercise with 3 days of strength training.    Vitamin D  deficiency Supplement with Ergocalciferol  50000 units once a week Low vitamin D  levels can be associated with adiposity and may result in leptin resistance and weight gain. Also associated with fatigue.  Currently on vitamin D  supplementation without any adverse effects such as nausea, vomiting or muscle weakness.   -     Vitamin D  (Ergocalciferol ); Take 1 capsule (50,000 Units total) by mouth every 7 (seven) days.  Dispense: 5 capsule; Refill: 1  Insulin  resistance Continue to implement Category 3  meal plan, limit simple carbohydrates. Increase lean protein,  water  and fiber.  Continue to follow regularly with PCP Continue exercise with current goal of 150 minutes of moderate to high intensity exercise/week.          Return in about 2 weeks (around 06/07/2024).SABRA She was informed of the importance of frequent follow up visits to maximize her success with intensive lifestyle modifications for her multiple health conditions.   ATTESTASTION STATEMENTS:  Reviewed by clinician on day of visit: allergies, medications, problem list, medical history, surgical history, family history, social history, and previous encounter notes.     Lonell Liverpool ANP-C "

## 2024-06-08 ENCOUNTER — Ambulatory Visit (INDEPENDENT_AMBULATORY_CARE_PROVIDER_SITE_OTHER): Admitting: Nurse Practitioner

## 2024-07-01 ENCOUNTER — Ambulatory Visit (INDEPENDENT_AMBULATORY_CARE_PROVIDER_SITE_OTHER): Admitting: Nurse Practitioner

## 2024-07-02 ENCOUNTER — Ambulatory Visit: Admitting: Radiology
